# Patient Record
Sex: Male | Born: 1990 | Race: White | Hispanic: No | Marital: Single | Smoking: Current every day smoker
Health system: Southern US, Community
[De-identification: ages and names within clinical notes are randomized; demographics above are authoritative.]

## PROBLEM LIST (undated history)

## (undated) DIAGNOSIS — F32A Depression, unspecified: Secondary | ICD-10-CM

## (undated) DIAGNOSIS — F329 Major depressive disorder, single episode, unspecified: Secondary | ICD-10-CM

## (undated) DIAGNOSIS — F319 Bipolar disorder, unspecified: Secondary | ICD-10-CM

## (undated) DIAGNOSIS — S3992XA Unspecified injury of lower back, initial encounter: Secondary | ICD-10-CM

## (undated) HISTORY — PX: OTHER SURGICAL HISTORY: SHX169

## (undated) HISTORY — PX: APPENDECTOMY: SHX54

---

## 2011-06-02 NOTE — BH Assessment (Signed)
Assessment Note   Nathan Poole is an 21 y.o. male. Pt presented to the [facility], escorted by the police officer that saw him sitting in a parking lot cutting himself with a piece of broken glass. He has a hx of self harm behaviors with multiple lacerations to his left forearm. He stated that he feels suicidal and states that he wants to kill himself, by cutting so badly that he will bleed to death. He stated a plan to continue cutting when he leaves the hospital ED. He recently broke up with his girlfriend and feels that it spurred this recent episode. He does not have weapons available. He has a previous dx of Depressive Disorder, NOS, with a previous hospitalization December 2012. His current medications are Depakote 250 mg TID,Welbutrin 100 mg BID, Lithonate 150 mg TID, Desyrel 50 mg HS, Abilify 10 mg QD. Pt's Hx of violence is unknown with no signs of hallucinations, delusions, obsessions. His SA hx includes beer, cannabis and Xanax. Pt currently lives with his mother and that he describes as having "difficult problems", he is unemployed and denies any legal hx. Pt stated that he has discontinued his provider services with [facility], because he did not feel it was helping him. He confirms that depression is on his mother's side of and refuses to contract for safety.  Pt states that he currently sees Dr. Blondell Reveal for medication management. Pt is accepted to [facility] by Dr. Allena Katz to Dr. Dan Humphreys, Rm 502-1.      Axis I: Depressive Disorder NOS Axis II: Deferred Axis III: No past medical history on file. Asthma, Seizures, Small Bowel Obstruction Axis IV: housing problems and other psychosocial or environmental problems Axis V: 21-30 behavior considerably influenced by delusions or hallucinations OR serious impairment in judgment, communication OR inability to function in almost all areas      Past Medical History: No past medical history on file.  No past surgical  history on file.  Family History: No family history on file.  Social History:  does not have a smoking history on file. He does not have any smokeless tobacco history on file. His alcohol and drug histories not on file.  Additional Social History:    Allergies: Allergies not on file  Home Medications:  No current facility-administered medications on file as of .   No current outpatient prescriptions on file as of .    OB/GYN Status:  No LMP for male patient.  General Assessment Data Location of Assessment: [facility] Assessment Services Living Arrangements: Family members Can pt return to current living arrangement?: Yes Admission Status: Voluntary Is patient capable of signing voluntary admission?: Yes Transfer from: Acute Hospital Referral Source: Self/Family/Friend  Education Status Is patient currently in school?: No  Risk to self Suicidal Ideation: Yes-Currently Present Suicidal Intent: Yes-Currently Present Is patient at risk for suicide?: Yes Suicidal Plan?: Yes-Currently Present Specify Current Suicidal Plan: Wants to cut self so badly the he bleeds to death Access to Means: Yes Specify Access to Suicidal Means: pt cut self with glass What has been your use of drugs/alcohol within the last 12 months?: beer weekly, cannabis monthly, xanax occasionally Previous Attempts/Gestures: Yes How many times?: 0  (number of attempts unknown) Triggers for Past Attempts: Family contact Intentional Self Injurious Behavior: Cutting Comment - Self Injurious Behavior: cutting Family Suicide History: Unknown Recent stressful life event(s): Conflict (Comment) (with his mother) Persecutory voices/beliefs?: No Depression: Yes Depression Symptoms: Feeling worthless/self pity Substance abuse history and/or treatment  for substance abuse?: Yes Suicide prevention information given to non-admitted patients: Not applicable  Risk to Others Homicidal Ideation: No Thoughts of Harm to Others:  No Current Homicidal Intent: No Current Homicidal Plan: No Access to Homicidal Means: No History of harm to others?: No Assessment of Violence: None Noted Does patient have access to weapons?: No Criminal Charges Pending?: No Does patient have a court date: No  Psychosis Hallucinations: None noted Delusions: None noted  Mental Status Report Appear/Hygiene: Other (Comment) (unknown) Eye Contact: Good Motor Activity: Freedom of movement Speech: Logical/coherent Level of Consciousness: Alert Mood: Depressed;Sad;Helpless Affect: Appropriate to circumstance Anxiety Level: Minimal Thought Processes: Coherent;Relevant Judgement: Impaired Orientation: Person;Place;Time;Situation Obsessive Compulsive Thoughts/Behaviors: None  Cognitive Functioning Concentration: Normal Memory: Recent Intact;Remote Intact IQ: Average Insight: Poor Impulse Control: Poor Appetite: Fair Sleep: No Change Total Hours of Sleep: 8  Vegetative Symptoms: None  Prior Inpatient Therapy Prior Inpatient Therapy: Yes Prior Therapy Dates: 0 (unknown) Reason for Treatment: self harm behaviors  Prior Outpatient Therapy Prior Outpatient Therapy: Yes Reason for Treatment: self harm behaviors                     Additional Information 1:1 In Past 12 Months?: No CIRT Risk: No Elopement Risk: No Does patient have medical clearance?: No     Disposition: pt accepted to [facility] by Dr. Allena Katz to Dr. Dan Humphreys, Rm 502-1.   Disposition Disposition of Patient: Inpatient treatment program Type of inpatient treatment program: Adult  On Site Evaluation by:   Reviewed with Physician:     Manual Meier 06/02/2011 4:39 PM

## 2011-06-03 ENCOUNTER — Encounter (HOSPITAL_COMMUNITY): Payer: Self-pay | Admitting: *Deleted

## 2011-06-03 ENCOUNTER — Inpatient Hospital Stay (HOSPITAL_COMMUNITY)
Admission: AD | Admit: 2011-06-03 | Discharge: 2011-06-08 | DRG: 885 | Disposition: A | Discharge status: Home / Self Care | Payer: 59 | Attending: Psychiatry | Admitting: Psychiatry

## 2011-06-03 DIAGNOSIS — Z885 Allergy status to narcotic agent status: Secondary | ICD-10-CM

## 2011-06-03 DIAGNOSIS — Z880 Allergy status to penicillin: Secondary | ICD-10-CM

## 2011-06-03 DIAGNOSIS — F332 Major depressive disorder, recurrent severe without psychotic features: Principal | ICD-10-CM

## 2011-06-03 DIAGNOSIS — F339 Major depressive disorder, recurrent, unspecified: Secondary | ICD-10-CM

## 2011-06-03 DIAGNOSIS — F329 Major depressive disorder, single episode, unspecified: Secondary | ICD-10-CM

## 2011-06-03 DIAGNOSIS — F3289 Other specified depressive episodes: Secondary | ICD-10-CM

## 2011-06-03 DIAGNOSIS — J45909 Unspecified asthma, uncomplicated: Secondary | ICD-10-CM

## 2011-06-03 DIAGNOSIS — R45851 Suicidal ideations: Secondary | ICD-10-CM

## 2011-06-03 DIAGNOSIS — Z79899 Other long term (current) drug therapy: Secondary | ICD-10-CM

## 2011-06-03 LAB — CBC
HCT: 39 % (ref 39.0–52.0)
MCHC: 34.9 g/dL (ref 30.0–36.0)
MCV: 85.7 fL (ref 78.0–100.0)
RDW: 13 % (ref 11.5–15.5)

## 2011-06-03 LAB — COMPREHENSIVE METABOLIC PANEL
Albumin: 4 g/dL (ref 3.5–5.2)
BUN: 11 mg/dL (ref 6–23)
Calcium: 9.8 mg/dL (ref 8.4–10.5)
Creatinine, Ser: 0.89 mg/dL (ref 0.50–1.35)
Total Protein: 7.8 g/dL (ref 6.0–8.3)

## 2011-06-03 MED ORDER — ALBUTEROL SULFATE HFA 108 (90 BASE) MCG/ACT IN AERS
2.0000 | INHALATION_SPRAY | Freq: Two times a day (BID) | RESPIRATORY_TRACT | Status: DC | PRN
Start: 2011-06-03 — End: 2011-06-08

## 2011-06-03 MED ORDER — MAGNESIUM HYDROXIDE 400 MG/5ML PO SUSP: 30.0000 mL | Freq: Every day | ORAL | Status: DC | PRN

## 2011-06-03 MED ORDER — ARIPIPRAZOLE 5 MG PO TABS: 5.0000 mg | ORAL_TABLET | Freq: Every day | ORAL | Status: DC

## 2011-06-03 MED ORDER — ALUM & MAG HYDROXIDE-SIMETH 200-200-20 MG/5ML PO SUSP: 30.0000 mL | ORAL | Status: DC | PRN

## 2011-06-03 MED ORDER — RISPERIDONE 0.5 MG PO TABS
0.5000 mg | ORAL_TABLET | Freq: Every day | ORAL | Status: DC
  Administered 2011-06-03: 0.5 mg via ORAL
  Filled 2011-06-03 (×2): qty 1

## 2011-06-03 MED ORDER — TRAZODONE HCL 100 MG PO TABS
200.0000 mg | ORAL_TABLET | Freq: Every day | ORAL | Status: DC
  Administered 2011-06-03 – 2011-06-07 (×5): 200 mg via ORAL
  Filled 2011-06-03 (×6): qty 2
  Filled 2011-06-03: qty 6

## 2011-06-03 MED ORDER — NICOTINE POLACRILEX 2 MG MT GUM
2.0000 mg | CHEWING_GUM | OROMUCOSAL | Status: DC | PRN
  Administered 2011-06-04 – 2011-06-08 (×14): 2 mg via ORAL
  Filled 2011-06-03: qty 2

## 2011-06-03 MED ORDER — ACETAMINOPHEN 325 MG PO TABS
650.0000 mg | ORAL_TABLET | Freq: Four times a day (QID) | ORAL | Status: DC | PRN
  Administered 2011-06-05: 650 mg via ORAL

## 2011-06-03 NOTE — Progress Notes (Signed)
Patient ID: Nathan Poole, male   DOB: 01-20-1991, 21 y.o.   MRN: [id number]  Patient was pleasant and cooperative during the assessment. STated that he cuts when he gets angry. Stated that after an argument with his mother, he began cutting his wrist. Pt has superficial cuts to both wrists. "I don't like when my mom yells at me because it makes me have flashbacks".  Stated that even though he's invol he's glad that he's at [facility]. Support and encouragement was offered.

## 2011-06-03 NOTE — Progress Notes (Addendum)
Patient ID: Nathan Poole, male   DOB: 1990-09-06, 21 y.o.   MRN: [id number] Pt is involuntary. Pt states he is here due to a suicide attempt where he attempted to slit his wrist. Pt stated he did this because he was upset with a lot of people. Pt states he is not depressed just very angry. Pt would like help with anger management while he is here. Pt denies SI/HI. Pt is pleasant and cooperative. Pt has cut marks to both forearms. Pt has a hx of cutting especially when he is mad. Pt states that he has a substance abuse problem and use maurijuana twice a week.
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
## 2011-06-03 NOTE — Tx Team (Signed)
Initial Interdisciplinary Treatment Plan  PATIENT STRENGTHS: (choose at least two) Ability for insight Active sense of humor Communication skills  PATIENT STRESSORS: Marital or family conflict Substance abuse   PROBLEM LIST: Problem List/Patient Goals Date to be addressed Date deferred Reason deferred Estimated date of resolution  "Anger management" 06/03/11     "Substance Abuse"  06/03/11                                                 DISCHARGE CRITERIA:  Ability to meet basic life and health needs Improved stabilization in mood, thinking, and/or behavior Reduction of life-threatening or endangering symptoms to within safe limits  PRELIMINARY DISCHARGE PLAN: Attend aftercare/continuing care group Return to previous living arrangement  PATIENT/FAMIILY INVOLVEMENT: This treatment plan has been presented to and reviewed with the patient, Nathan Poole, and/or family member.  The patient and family have been given the opportunity to ask questions and make suggestions.  Nathan Poole 06/03/2011, 4:24 PM

## 2011-06-03 NOTE — H&P (Signed)
Medical/psychiatric screening examination/treatment/procedure(s) were performed by non-physician practitioner and as supervising physician I was immediately available for consultation/collaboration.   I have seen and examined this patient and agree with this evaluation.  
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
## 2011-06-04 LAB — TSH: TSH: 0.583 u[IU]/mL (ref 0.350–4.500)

## 2011-06-04 LAB — VITAMIN D 25 HYDROXY (VIT D DEFICIENCY, FRACTURES): Vit D, 25-Hydroxy: 23 ng/mL — ABNORMAL LOW (ref 30–89)

## 2011-06-04 MED ORDER — RISPERIDONE 0.5 MG PO TABS
0.5000 mg | ORAL_TABLET | Freq: Two times a day (BID) | ORAL | Status: DC
  Administered 2011-06-04 – 2011-06-08 (×9): 0.5 mg via ORAL
  Filled 2011-06-04: qty 6
  Filled 2011-06-04: qty 1
  Filled 2011-06-04: qty 6
  Filled 2011-06-04 (×9): qty 1

## 2011-06-04 NOTE — Progress Notes (Signed)
Pt rates depression at a 6 and hopelessness at a 3. Pt attends groups and interacts well with peers and staff. Pt was offered support and encouragement. Pt would like to go to the state hospital.Pt denies SI/HI. Pt is receptive to treatment and safety is maintained on unit.

## 2011-06-04 NOTE — Progress Notes (Signed)
Pt has been observed interacting well on the unit. Pt attended karaoke this evening. Pt  reports no thoughts of SI/HI at this time. Pt reports no concerns he wishes to address with this writer at this time. Continued support and availability as needed has been extended to this pt. Pt safety remains with q64min checks.

## 2011-06-04 NOTE — Tx Team (Signed)
Interdisciplinary Treatment Plan Update (Adult)  Date:  06/04/2011  Time Reviewed:  10:26 AM   Progress in Treatment: Attending groups: Yes Participating in groups:  Yes Taking medication as prescribed: Yes Tolerating medication:  Yes Family/Significant othe contact made:  Counselor assessing for appropriate contact Patient understands diagnosis:  Yes Discussing patient identified problems/goals with staff:  Yes Medical problems stabilized or resolved:  Yes Denies suicidal/homicidal ideation: Yes Issues/concerns per patient self-inventory:  None identified Other: N/A  New problem(s) identified: None Identified  Reason for Continuation of Hospitalization: Anxiety Depression Medication stabilization Suicidal ideation  Interventions implemented related to continuation of hospitalization: mood stabilization, medication monitoring and adjustment, group therapy and psycho education, safety checks q 15 mins  Additional comments: N/A  Estimated length of stay: 3-5 days  Discharge Plan: SW will assess for appropriate referrals.    New goal(s): N/A  Review of initial/current patient goals per problem list:    1.  Goal(s): Reduce depressive symptoms  Met:  No  Target date: by discharge  As evidenced by: Reducing depression from a 10 to a 3 as reported by pt.   2.  Goal (s): Eliminate SI  Met:  No  Target date: by discharge  As evidenced by: pt reporting no SI.    3.  Goal(s): Address substance abuse  Met:  No  Target date: by discharge  As evidenced by: Pt reports marijuana use and wants to address this in group therapy   Attendees: Patient:  Nathan Poole 06/04/2011 11:31 AM   Family:     Physician:  Orson Aloe, MD  06/04/2011  10:26 AM   Nursing:   Quintella Reichert, RN 06/04/2011 11:31 AM   Case Manager:  Reyes Ivan, LCSWA 06/04/2011  10:26 AM   Counselor:  06/04/2011  10:26 AM   Other:  Juline Patch, LCSW 06/04/2011  10:26 AM   Other:  Wilmon Arms, SW intern 06/04/2011  10:26 AM   Other:  Serena Colonel, NP 06/04/2011 11:32 AM   Other:      Scribe for Treatment Team:   Carmina Miller, 06/04/2011 , 10:26 AM
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
## 2011-06-05 MED ORDER — GABAPENTIN 300 MG PO CAPS
300.0000 mg | ORAL_CAPSULE | Freq: Three times a day (TID) | ORAL | Status: DC
  Administered 2011-06-05 – 2011-06-08 (×9): 300 mg via ORAL
  Filled 2011-06-05 (×2): qty 1
  Filled 2011-06-05: qty 6
  Filled 2011-06-05: qty 1
  Filled 2011-06-05: qty 6
  Filled 2011-06-05 (×2): qty 1
  Filled 2011-06-05: qty 6
  Filled 2011-06-05 (×6): qty 1

## 2011-06-05 MED ORDER — CITALOPRAM HYDROBROMIDE 10 MG PO TABS
10.0000 mg | ORAL_TABLET | Freq: Every day | ORAL | Status: DC
  Administered 2011-06-05 – 2011-06-08 (×4): 10 mg via ORAL
  Filled 2011-06-05 (×2): qty 1
  Filled 2011-06-05: qty 14
  Filled 2011-06-05 (×3): qty 1

## 2011-06-05 MED ORDER — CLONIDINE HCL 0.1 MG PO TABS
0.1000 mg | ORAL_TABLET | Freq: Every evening | ORAL | Status: DC | PRN
  Administered 2011-06-05 – 2011-06-07 (×5): 0.1 mg via ORAL
  Filled 2011-06-05 (×9): qty 1

## 2011-06-05 MED ORDER — CALCIUM CARBONATE-VITAMIN D 500-200 MG-UNIT PO TABS
1.0000 | ORAL_TABLET | Freq: Two times a day (BID) | ORAL | Status: DC
  Administered 2011-06-06 – 2011-06-08 (×5): 1 via ORAL
  Filled 2011-06-05 (×8): qty 1

## 2011-06-05 MED ORDER — CLONIDINE HCL ER 0.1 MG PO TB12
0.1000 mg | ORAL_TABLET | Freq: Every day | ORAL | Status: DC
  Administered 2011-06-05 – 2011-06-07 (×3): 0.1 mg via ORAL
  Filled 2011-06-05 (×4): qty 1
  Filled 2011-06-05: qty 2

## 2011-06-05 MED ORDER — VITAMIN D (ERGOCALCIFEROL) 1.25 MG (50000 UNIT) PO CAPS
50000.0000 [IU] | ORAL_CAPSULE | ORAL | Status: DC
  Administered 2011-06-06: 50000 [IU] via ORAL
  Filled 2011-06-05: qty 1

## 2011-06-05 MED ORDER — CITALOPRAM HYDROBROMIDE 10 MG/5ML PO SOLN: 10.0000 mg | Freq: Every day | ORAL | Status: DC

## 2011-06-05 NOTE — Progress Notes (Signed)
Patient ID: Nathan Poole, male   DOB: 02-09-1991, 21 y.o.   MRN: [id number] Pt seen in his room individually and in treatment team.  He was sleeping face down and aroused after knocking some paper loudly near him and speaking his name loudly.  He stirred only minimally initially.  He answered questions slowly, but then got to a conversational cadence as the interview in his room progressed.  He stated that his anger gets the most intense during the middle of the day.  He noted some benefit from the Risperdal at [facility] the night before.  He agreed to take Risperdal during the day.  He expressed a desire to go back to [facility] where he had been for a long time.  He stated that he felt comfortable there and wished to return there.  In treatment team he reiterated the same notion of desiring to return to [facility]. He described his mood as pretty good and his anxiety pretty good first thing in the morning, but that during the early afternoon is when his moods get more harsh.  I ordered the Risperdal for 1200 and 2000.  He was agreeable to that plan.

## 2011-06-05 NOTE — Progress Notes (Signed)
Recreation Therapy Notes  06/05/2011         Time: 1415      Group Topic/Focus: The focus of the group is on enhancing the patients' ability to cope with stressors by understanding what coping is, why it is important, the negative effects of stress and developing healthier coping skills. Patients asked to complete a fifteen minute plan, outlining three triggers, three supports, and fifteen coping activities.  Participation Level: Active  Participation Quality: Attentive  Affect: Blunted  Cognitive: Oriented   Additional Comments: Patient continues to blame his mother for everything, didn't respond to challenging by RT.  Ginny Loomer 06/05/2011 4:05 PM

## 2011-06-05 NOTE — Progress Notes (Signed)
Pt denies SI/HI/AVH. Pt grandiose. Pt states that he bought his friend an official Ravens' football from 1972 and if the Ravens win the ball will be worth 3.1 million dollars. Pt cooperative. Continue Q15 min checks.

## 2011-06-05 NOTE — Progress Notes (Signed)
[facility] Group Notes:  (Counselor/Nursing/MHT/Case Management/Adjunct)  06/05/2011 12:55 PM  Type of Therapy:  Group Therapy  Participation Level:  None  Participation Quality:  Appropriate and Attentive  Affect:  Appropriate  Cognitive:  Alert and Appropriate  Insight:  None  Engagement in Group:  None  Engagement in Therapy:  None  Modes of Intervention:  Education and Exploration  Summary of Progress/Problems: Patient did not verbally participate in group discussion.   Wilmon Arms 06/05/2011, 12:55 PM  Cosigned by: Angus Palms, LCSW

## 2011-06-05 NOTE — Progress Notes (Addendum)
[facility] MD Progress Note  06/05/2011 10:13 PM  ADL's:  Intact  Sleep: Poor, will add Clonidine and Kapvay for this  Appetite:  Good  Suicidal Ideation:  Denies adamantly any suicidal thoughts. Homicidal Ideation:  Denies adamantly any homicidal thoughts.  Mental Status Examination/Evaluation: Objective:  Appearance: Casual  Eye Contact::  Good  Speech:  Clear and Coherent  Volume:  Normal  Mood:  Anxious and Depressed  Affect:  Congruent  Thought Process:  Coherent  Orientation:  Full  Thought Content:  WDL  Suicidal Thoughts:  No  Homicidal Thoughts:  No  Memory:  Immediate;   Good  Judgement:  Fair  Insight:  Fair  Psychomotor Activity:  Normal  Concentration:  Fair  Recall:  Good  Akathisia:  No  AIMS (if indicated):     Assets:  Communication Skills Desire for Improvement Leisure Time  Sleep:  Number of Hours: 5.75    Vital Signs:Blood pressure 133/71, pulse 80, temperature 96.7 F (35.9 C), temperature source Oral, resp. rate 16, height 6\' 6"  (1.981 m), weight 99.791 kg (220 lb). Current Medications: Current Facility-Administered Medications  Medication Dose Route Frequency Provider Last Rate Last Dose  . acetaminophen (TYLENOL) tablet 650 mg  650 mg Oral Q6H PRN Orson Aloe, MD   650 mg at 06/05/11 1256  . albuterol (PROVENTIL HFA;VENTOLIN HFA) 108 (90 BASE) MCG/ACT inhaler 2 puff  2 puff Inhalation BID PRN Orson Aloe, MD      . alum & mag hydroxide-simeth (MAALOX/MYLANTA) 200-200-20 MG/5ML suspension 30 mL  30 mL Oral Q4H PRN Orson Aloe, MD      . citalopram (CELEXA) tablet 10 mg  10 mg Oral Daily Orson Aloe, MD   10 mg at 06/05/11 1834  . cloNIDine (CATAPRES) tablet 0.1 mg  0.1 mg Oral QHS,MR X 1 Orson Aloe, MD   0.1 mg at 06/05/11 2203  . cloNIDine HCl (KAPVAY) ER tablet 0.1 mg  0.1 mg Oral QHS Orson Aloe, MD      . gabapentin (NEURONTIN) capsule 300 mg  300 mg Oral TID Orson Aloe, MD   300 mg at 06/05/11 1834  . magnesium hydroxide (MILK OF  MAGNESIA) suspension 30 mL  30 mL Oral Daily PRN Orson Aloe, MD      . nicotine polacrilex (NICORETTE) gum 2 mg  2 mg Oral Q2H PRN Orson Aloe, MD   2 mg at 06/04/11 2155  . risperiDONE (RISPERDAL) tablet 0.5 mg  0.5 mg Oral BID AC Orson Aloe, MD   0.5 mg at 06/05/11 2203  . traZODone (DESYREL) tablet 200 mg  200 mg Oral QHS Orson Aloe, MD   200 mg at 06/05/11 2203  . DISCONTD: citalopram (CELEXA) 10 MG/5ML suspension 10 mg  10 mg Oral Daily Orson Aloe, MD        Lab Results: No results found for this or any previous visit (from the past 48 hour(s)).  Physical Findings: AIMS:  , ,  ,  ,    CIWA:    COWS:     Treatment Plan Summary: Daily contact with patient to assess and evaluate symptoms and progress in treatment Medication management  Plan: Add Clonidine and Kapvay for difficulty sleeping and Neurontin for anxiety.  Plan for discharge for Tue of next week.  Pt agreeable with all these proposals. Vit D is low at 23 will order replacement Vit DDan Humphreys, Latanga Nedrow 06/05/2011, 10:13 PM

## 2011-06-05 NOTE — Progress Notes (Addendum)
Jaevin is consistently refusing to cooperate with counselors to complete his psychosocial assessment. He will not answer assessment questions for either the counseling intern or the counselor. In addition, he will not come to aftercare planning group after being encouraged and specifically invited by counseling intern and case manager. Counselor spoke with Avien and explained that both assessment and groups are a part of treatment here. Elick was argumentative, but agreed to come to group, though he remained irritable. Carly's statments during the assessment were sometimes contradictory (states he worked at [employer] for 4 years but that he was in [place] for two of those years, and that he was in college at [employer] for 3 of them. ) and increasingly histrionic in nature. Counselor suspects he may not be a reliable reporter.  Billie Lade 06/05/2011  8:37 AM

## 2011-06-05 NOTE — Progress Notes (Signed)
Pt attended discharge planning group and minimally participated.  Pt reports not sleeping well last night.  Pt ranks depression at a 6 and anxiety at a 5 today.  Pt denies SI at this time.  Pt was asked further questions by the counselor.  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  Pt also states that he went to [employer] in [place] and transferred to [employer] for a double major and has 11 months left to graduate.  Pt states he moved here 3 months ago.  Pt states he worked at [employer] for 4 years and currently works as a Clinical cytogeneticist.  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  SW notes the timeline for all of this does not match up and appears to be grandiose.  [facility] and is on the waiting list.  No further needs at this time.    Reyes Ivan, LCSWA 06/05/2011  10:56 AM

## 2011-06-05 NOTE — Progress Notes (Signed)
Pt does not a good grasp on reality. Pt is a bad historian and all of his stories does not make sense. Pt has stated that he would like to go to the state hospital. Pt stated that he can not go back home to his mother because she upsets him which causes him to cut. Pt attends groups but lacks insight. Pt was offered support and encouragement. Pt safety maintained on unit.

## 2011-06-06 DIAGNOSIS — F339 Major depressive disorder, recurrent, unspecified: Secondary | ICD-10-CM | POA: Diagnosis present

## 2011-06-06 DIAGNOSIS — F332 Major depressive disorder, recurrent severe without psychotic features: Principal | ICD-10-CM

## 2011-06-06 NOTE — Progress Notes (Signed)
Pt is asleep at this time. Pt appears to be in no sings of distress. Writer will continue to monitor pt. Pt safety remains with q69min checks.

## 2011-06-06 NOTE — Progress Notes (Signed)
[facility] MD Progress Note  06/06/2011 4:17 PM  "I have to go back to work on Tuesday 06/09/11. 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 I can't lose this job and I don't want to be fired either. I must be discharged on Monday. I am feeling well. I am ready to go home. I don't feel suicidal no more"  Diagnosis:   Axis I: Major depressive disorder, recuurent Axis II: Borderline behavior Axis III:  Past Medical History  Diagnosis Date  . Asthma    Axis IV: No changes Axis V: 51-60 moderate symptoms  ADL's:  Intact  Sleep: Good  Appetite:  Good  Suicidal Ideation: Plan:  None Intent:  None Means:  None Homicidal Ideation:  Plan:  None Intent:  None Means:  None  AEB (as evidenced by): Per patient's report Mental Status Examination/Evaluation: Objective:  Appearance: Casual  Eye Contact::  Good  Speech:  Clear and Coherent  Volume:  Normal  Mood:  Euthymic  Affect:  Appropriate  Thought Process:  Intact  Orientation:  Full  Thought Content:  Clear  Suicidal Thoughts:  No  Homicidal Thoughts:  No  Memory:  Immediate;   Good  Judgement:  Fair  Insight:  Lacking  Psychomotor Activity:  Normal  Concentration:  Good  Recall:  Good  Akathisia:  No  Handed:  Right  AIMS (if indicated):     Assets:  Others:  eager to continue employment  Sleep:  Number of Hours: 6.75    Vital Signs:Blood pressure 78/48, pulse 86, temperature 97.7 F (36.5 C), temperature source Oral, resp. rate 20, height 6\' 6"  (1.981 m), weight 99.791 kg (220 lb). Current Medications: Current Facility-Administered Medications  Medication Dose Route Frequency Provider Last Rate Last Dose  . acetaminophen (TYLENOL) tablet 650 mg  650 mg Oral Q6H PRN Orson Aloe, MD   650 mg at 06/05/11 1256  . albuterol (PROVENTIL HFA;VENTOLIN HFA) 108 (90 BASE) MCG/ACT inhaler 2 puff  2 puff Inhalation BID PRN Orson Aloe, MD      . alum & mag hydroxide-simeth (MAALOX/MYLANTA) 200-200-20 MG/5ML suspension 30 mL  30 mL Oral  Q4H PRN Orson Aloe, MD      . calcium-vitamin D (OSCAL WITH D) 500-200 MG-UNIT per tablet 1 tablet  1 tablet Oral BID Orson Aloe, MD   1 tablet at 06/06/11 0800  . citalopram (CELEXA) tablet 10 mg  10 mg Oral Daily Orson Aloe, MD   10 mg at 06/06/11 0946  . cloNIDine (CATAPRES) tablet 0.1 mg  0.1 mg Oral QHS,MR X 1 Orson Aloe, MD   0.1 mg at 06/05/11 2203  . cloNIDine HCl (KAPVAY) ER tablet 0.1 mg  0.1 mg Oral QHS Orson Aloe, MD   0.1 mg at 06/05/11 2219  . gabapentin (NEURONTIN) capsule 300 mg  300 mg Oral TID Orson Aloe, MD   300 mg at 06/06/11 1438  . magnesium hydroxide (MILK OF MAGNESIA) suspension 30 mL  30 mL Oral Daily PRN Orson Aloe, MD      . nicotine polacrilex (NICORETTE) gum 2 mg  2 mg Oral Q2H PRN Orson Aloe, MD   2 mg at 06/06/11 1437  . risperiDONE (RISPERDAL) tablet 0.5 mg  0.5 mg Oral BID AC Orson Aloe, MD   0.5 mg at 06/06/11 1244  . traZODone (DESYREL) tablet 200 mg  200 mg Oral QHS Orson Aloe, MD   200 mg at 06/05/11 2203  . Vitamin D (Ergocalciferol) (DRISDOL) capsule 50,000 Units  50,000 Units  Oral Q Julienne Kass, MD   50,000 Units at 06/06/11 [phone]  . DISCONTD: citalopram (CELEXA) 10 MG/5ML suspension 10 mg  10 mg Oral Daily Orson Aloe, MD        Lab Results: No results found for this or any previous visit (from the past 48 hour(s)).  Physical Findings: AIMS:  , ,  ,  ,    CIWA:    COWS:     Treatment Plan Summary: Daily contact with patient to assess and evaluate symptoms and progress in treatment Medication management  Plan: Continue current treatment plan.  Armandina Stammer I 06/06/2011, 4:17 PM

## 2011-06-06 NOTE — Progress Notes (Signed)
Patient ID: Nathan Poole, male   DOB: Nov 11, 1990, 21 y.o.   MRN: [id number] 06/06/2011  Nursing 1800   [name] is seen OOB UAL on the 500 hall today...tolerated wel. HE makes good eye contact. He is pleasant , takes his meds as ordered, and attends his groups as ordered.   A He completed his self invneotry and on it he wrote that he deneid SI, that his feelings of depression and hoplessness are " 6 / 2 " and that his DC plans include to " take my coping skills with me". R Safety is maintaiend and POC incldues fosteruing therapueitc relationship PD RN [facility]

## 2011-06-06 NOTE — Progress Notes (Signed)
Patient was seen by writer at the medication window to receive his scheduled 2000 medications. Patient reports having had a good day and is looking forward to attending AA meeting on 300 hall. Patient was informed of his 2200 and 2300 scheduled medications and is agreeable to taking them. Patient currently denies having pain, -si/hi/a/v hall. Safety  maintained on unit will continue to monitor.

## 2011-06-06 NOTE — Progress Notes (Signed)
[facility] Group Notes:  (Counselor/Nursing/MHT/Case Management/Adjunct)  06/06/2011 1315  Type of Therapy:  Group Therapy  Participation Level:  Active  Participation Quality:  Appropriate and Attentive  Affect:  Appropriate  Cognitive:  Appropriate  Insight:  Good  Engagement in Group:  Good  Engagement in Therapy:  Good  Modes of Intervention:  Activity, Problem-solving and Socialization  Summary of Progress/Problems:  Pt. attended and participated in group session on self-sabotage. A Dialectical Behavior Therapy   Exercise was presented to the pt. and pt. was asked to identify how their emotional and/or rational behaviors affect how they sabotage themselves.   Pt shared that he is still grieving over the loss of his brother who was in the [employer]	[place]. Pt states that the loss of his brother has been difficult because he was the person that helps him with his problems. Pt states that he served 2 years in [place] in the [employer] and had to D/C from the [employer] due to his brothers death. Pt did not participate until the end of group, however he was attentive and interactive with other group members.   Joretta Eads 06/06/2011; 3:26PM

## 2011-06-07 NOTE — Progress Notes (Signed)
Patient has been in the dayroom watching tv and interacting appropriately with peers. Patient has voiced no complaints, took his hs medications and currently denies having pain, -si/hi/a/v hall. Safety maintained on unit, will continue to monitor.

## 2011-06-07 NOTE — Progress Notes (Signed)
Spoke to pt and he stated that he would like to go home instead of going to [facility] due to needing to be at work Tues and wanting to be home for his daughters birthdays. He spoke about a plan to take the bus to [facility] where his mother can pick him up. He stated he no longer wants his mother to be contacted for the number he gave is his cell and he does not want his mother called about his D/C. Pt does have bed avaiable for him at [facility] Monday.

## 2011-06-07 NOTE — Progress Notes (Signed)
[facility] MD Progress Note  06/07/2011 6:45 PM  "I am doing fine. I just want to know that I will be going home in the morning. I have to be at work on Tuesday. I can't loose a job that I make $4,550 a week. I am not suicidal or homicidal. I am just fine. Ready to go" Social worker reports today that a bed is available for patient at the [facility] in [place]. She indicated that patient will be transferring to [facility] sometime tomorrow 06/08/11  Diagnosis:   Axis I: Major Depression, Recurrent severe Axis II: Cluster B Traits Axis III:  Past Medical History  Diagnosis Date  . Asthma    Axis IV: other psychosocial or environmental problems Axis V: 41-50 serious symptoms  ADL's:  Intact  Sleep: Good  Appetite:  Good  Suicidal Ideation:  Plan:  None Intent:  None Means:  None Homicidal Ideation:  Plan:  None Intent:  None Means:  None  AEB (as evidenced by): Per patient's report  Mental Status Examination/Evaluation: Objective:  Appearance: Casual  Eye Contact::  Good  Speech:  Clear and Coherent  Volume:  Normal  Mood:  Euphoric  Affect:  Congruent  Thought Process:  Irrational  Orientation:  Full  Thought Content:  Delusions  Suicidal Thoughts:  No  Homicidal Thoughts:  No  Memory:  Immediate;   Good  Judgement:  Impaired  Insight:  Lacking  Psychomotor Activity:  Normal  Concentration:  Good  Recall:  Good  Akathisia:  No  Handed:  Right  AIMS (if indicated):     Assets:  Social Support  Sleep:  Number of Hours: 6.75    Vital Signs:Blood pressure 104/57, pulse 91, temperature 97.6 F (36.4 C), temperature source Oral, resp. rate 16, height 6\' 6"  (1.981 m), weight 99.791 kg (220 lb). Current Medications: Current Facility-Administered Medications  Medication Dose Route Frequency Provider Last Rate Last Dose  . acetaminophen (TYLENOL) tablet 650 mg  650 mg Oral Q6H PRN Orson Aloe, MD   650 mg at 06/05/11 1256  . albuterol (PROVENTIL HFA;VENTOLIN HFA)  108 (90 BASE) MCG/ACT inhaler 2 puff  2 puff Inhalation BID PRN Orson Aloe, MD      . alum & mag hydroxide-simeth (MAALOX/MYLANTA) 200-200-20 MG/5ML suspension 30 mL  30 mL Oral Q4H PRN Orson Aloe, MD      . calcium-vitamin D (OSCAL WITH D) 500-200 MG-UNIT per tablet 1 tablet  1 tablet Oral BID Orson Aloe, MD   1 tablet at 06/07/11 1706  . citalopram (CELEXA) tablet 10 mg  10 mg Oral Daily Orson Aloe, MD   10 mg at 06/07/11 0813  . cloNIDine (CATAPRES) tablet 0.1 mg  0.1 mg Oral QHS,MR X 1 Orson Aloe, MD   0.1 mg at 06/06/11 2256  . cloNIDine HCl (KAPVAY) ER tablet 0.1 mg  0.1 mg Oral QHS Orson Aloe, MD   0.1 mg at 06/06/11 2207  . gabapentin (NEURONTIN) capsule 300 mg  300 mg Oral TID Orson Aloe, MD   300 mg at 06/07/11 1414  . magnesium hydroxide (MILK OF MAGNESIA) suspension 30 mL  30 mL Oral Daily PRN Orson Aloe, MD      . nicotine polacrilex (NICORETTE) gum 2 mg  2 mg Oral Q2H PRN Orson Aloe, MD   2 mg at 06/07/11 1707  . risperiDONE (RISPERDAL) tablet 0.5 mg  0.5 mg Oral BID AC Orson Aloe, MD   0.5 mg at 06/07/11 1247  . traZODone (DESYREL) tablet  200 mg  200 mg Oral QHS Orson Aloe, MD   200 mg at 06/06/11 2208  . Vitamin D (Ergocalciferol) (DRISDOL) capsule 50,000 Units  50,000 Units Oral Q Julienne Kass, MD   50,000 Units at 06/06/11 [phone]    Lab Results: No results found for this or any previous visit (from the past 48 hour(s)).  Physical Findings: AIMS:  , ,  ,  ,    CIWA:    COWS:     Treatment Plan Summary: Daily contact with patient to assess and evaluate symptoms and progress in treatment Medication management  Plan: Patient is likely going to [facility] 06/08/11 for further treatment.           He continue on current treatment plan.            Armandina Stammer I 06/07/2011, 6:45 PM

## 2011-06-07 NOTE — Progress Notes (Signed)
[facility] Group Notes:  (Counselor/Nursing/MHT/Case Management/Adjunct)  06/07/2011 1315  Type of Therapy:  Group Therapy  Participation Level:  Active  Participation Quality:  Appropriate, Attentive, Redirectable and Sharing  Affect:  Appropriate  Cognitive:  Appropriate  Insight:  Good  Engagement in Group:  Good  Engagement in Therapy:  Good  Modes of Intervention:  Clarification, Problem-solving, Socialization and Support  Summary of Progress/Problems: Pt attended and participated counseling group on supports. Pt was asked to define what support means to them, identify a support and explore what to do when support is not present. Pt states that he expects support from his family the most. Pt states that he also considers his friend a support to him because his friend enjoys sharing fun and interesting activities with him. Pt stated that his friend is his Sky Diving partner and that he understands him like no one else does.   Roddie Riegler 06/07/2011, 3:08 PM

## 2011-06-07 NOTE — Progress Notes (Signed)
Patient ID: Nathan Poole, male   DOB: [birth year], 21 y.o.   MRN: [id number] 06/07/2011  Nursing 1900 D Nathan Poole continues to make progress..he is compliant with his meds. He is attending his groups. He participates in the gorup discussion and is consciously trying to understand huis feelings and his related behaviors and to learn healthier ways of getting his needs met..ASSESSMENT: He completed his self inventory and on it he wrote that he denied SI, he rated his depression and hopelessness " 5 / 1 "  He stated his DC plans are to take his meds and play with his daughters. R Safety is amintaiend and POC includes fostering therapeutic relationship PD RN [facility]

## 2011-06-07 NOTE — Progress Notes (Signed)
06/07/2011 9:37 AM                           Case Manager Note: Pt attended after care planning group, pt denies SI/HI pt received suicide prevention informations and crisis numbers. Pt shared he believes he will discharge Monday and states he will follow-up with [facility] in [facility]. Cavan Bearden, LPCA

## 2011-06-08 MED ORDER — VITAMIN D (ERGOCALCIFEROL) 1.25 MG (50000 UNIT) PO CAPS: 50000.0000 [IU] | ORAL_CAPSULE | ORAL | Status: DC

## 2011-06-08 MED ORDER — CITALOPRAM HYDROBROMIDE 10 MG PO TABS: 10.0000 mg | ORAL_TABLET | Freq: Every day | ORAL | Status: DC

## 2011-06-08 MED ORDER — CLONIDINE HCL 0.1 MG PO TABS: 0.1000 mg | ORAL_TABLET | Freq: Every evening | ORAL | Status: DC | PRN

## 2011-06-08 MED ORDER — RISPERIDONE 0.5 MG PO TABS: 0.5000 mg | ORAL_TABLET | Freq: Two times a day (BID) | ORAL | Status: DC

## 2011-06-08 MED ORDER — CLONIDINE HCL ER 0.1 MG PO TB12: 0.1000 mg | ORAL_TABLET | Freq: Every day | ORAL | Status: DC

## 2011-06-08 MED ORDER — GABAPENTIN 300 MG PO CAPS: 300.0000 mg | ORAL_CAPSULE | Freq: Three times a day (TID) | ORAL | Status: DC

## 2011-06-08 MED ORDER — ALBUTEROL SULFATE HFA 108 (90 BASE) MCG/ACT IN AERS: 2.0000 | INHALATION_SPRAY | Freq: Two times a day (BID) | RESPIRATORY_TRACT | Status: DC | PRN

## 2011-06-08 MED ORDER — TRAZODONE HCL 100 MG PO TABS: 200.0000 mg | ORAL_TABLET | Freq: Every day | ORAL | Status: DC

## 2011-06-08 MED ORDER — CALCIUM CARBONATE-VITAMIN D 500-200 MG-UNIT PO TABS: 1.0000 | ORAL_TABLET | Freq: Two times a day (BID) | ORAL | Status: DC

## 2011-06-08 NOTE — Progress Notes (Signed)
[facility] Group Notes: (Counselor/Nursing/MHT/Case Management/Adjunct) 06/08/2011   @1 :15pm  Type of Therapy:  Group Therapy  Participation Level:  Active  Participation Quality:  Attentive, Appropriate  Affect:  Appropriate  Cognitive:  Appropriate  Insight:  None  Engagement in Group: Active  Engagement in Therapy:  Limited  Modes of Intervention:  Support and Exploration  Summary of Progress/Problems: Nathan Poole participated in color breathing technique and safe place guide imagery meditation. He did not share his experience of the exercises.   Billie Lade 06/08/2011 2:12 PM

## 2011-06-08 NOTE — Progress Notes (Signed)
[facility] Case Management Discharge Plan:  Will you be returning to the same living situation after discharge: Yes,  return home At discharge, do you have transportation home?:Yes,  provided bus pass home Do you have the ability to pay for your medications:Yes,  provided samples  Interagency Information:   Release of information consent forms completed and in the chart; pt's signature needed at discharge.   Release of information consent forms completed and in the chart;  Patient's signature needed at discharge.  Patient to Follow up at:  Follow-up Information    Follow up with [facility] in [place] on 06/10/2011. (Appointment scheduled at 8:00 am, Auth # [id number])    Contact information:   [address] [place], [place] [address] [phone]         Patient denies SI/HI:   Yes,  denies today    Safety Planning and Suicide Prevention discussed:  Yes,  discussed with pt  Barrier to discharge identified:No.  Summary and Recommendations: Pt attended discharge planning group and actively participated.  Pt presents with calm mood and affect.  Pt reports feeling much better and ready to d/c today.  Pt states that he has no depression or SI today.  Pt states that his anxiety is high, ranking it at a 10.  Pt states he is eager to d/c to work at [employer].  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  Pt also states that he bid and won $100,000 on the game.  Pt states that he will return home and needs a bus pass to get home.  No recommendations from SW.  No further needs voiced by pt.  Pt stable to discharge.      Carmina Miller 06/08/2011, 12:38 PM

## 2011-06-08 NOTE — [facility] Suicide Risk Assessment (Signed)
Suicide Risk Assessment  Discharge Assessment     Demographic factors:  Assessment Details Time of Assessment: Admission Information Obtained From: Family Current Mental Status:  Current Mental Status:  (Denies SI/HI) Risk Reduction Factors:  Risk Reduction Factors: Employed;Living with another person, especially a relative;Positive social support  CLINICAL FACTORS:   Severe Anxiety and/or Agitation Depression:   Anhedonia Hopelessness Personality Disorders:   Cluster B Previous Psychiatric Diagnoses and Treatments Medical Diagnoses and Treatments/Surgeries  COGNITIVE FEATURES THAT CONTRIBUTE TO RISK:  Thought constriction (tunnel vision)    SUICIDE RISK:   Minimal: No identifiable suicidal ideation.  Patients presenting with no risk factors but with morbid ruminations; may be classified as minimal risk based on the severity of the depressive symptoms  ADL's:  Intact  Sleep: Good  Appetite:  Good  Suicidal Ideation:  Denies adamantly any suicidal thoughts. Homicidal Ideation:  Denies adamantly any homicidal thoughts.  Mental Status Examination/Evaluation: Objective:  Appearance: Casual  Eye Contact::  Good  Speech:  Clear and Coherent  Volume:  Normal  Mood:  Euthymic  Affect:  Congruent  Thought Process:  Coherent  Orientation:  Full  Thought Content:  WDL  Suicidal Thoughts:  No  Homicidal Thoughts:  No  Memory:  Immediate;   Good  Judgement:  Good  Insight:  Good  Psychomotor Activity:  Normal  Concentration:  Good  Recall:  Good  Akathisia:  No  AIMS (if indicated):      Assets:  Communication Skills Desire for Improvement Housing  Sleep:  Number of Hours: 6.5     Vital Signs: Blood pressure 107/58, pulse 97, temperature 97.9 F (36.6 C), temperature source Oral, resp. rate 18, height 6\' 6"  (1.981 m), weight 99.791 kg (220 lb). Labs No results found for this or any previous visit (from the past 48 hour(s)).  What pt reports he has learned from his  stay is that he needs to do stuff with friends and he can write out his feelings and tear up the paper and then throw it away,   Risk of harm to self is elevated by his history of depression, but he now feels that he has to live for getting back with his friends and family.  Risk of harm to others is is minimal in that he has not been involved in fights or had any legal charges filed on him.  Plan: Current Medications:  . calcium-vitamin D  1 tablet Oral BID  . citalopram  10 mg Oral Daily  . cloNIDine  0.1 mg Oral QHS,MR X 1  . cloNIDine HCl  0.1 mg Oral QHS  . gabapentin  300 mg Oral TID  . risperiDONE  0.5 mg Oral BID AC  . traZODone  200 mg Oral QHS  . Vitamin D (Ergocalciferol)  50,000 Units Oral Q Juliann Mule, Omair Dettmer 06/08/2011, 12:28 PM

## 2011-06-08 NOTE — Tx Team (Signed)
Interdisciplinary Treatment Plan Update (Adult)  Date:  06/08/2011  Time Reviewed:  11:19 AM   Progress in Treatment: Attending groups: Yes Participating in groups:  Yes Taking medication as prescribed: Yes Tolerating medication:  Yes Family/Significant othe contact made:  Rescinded consent Patient understands diagnosis:  Yes Discussing patient identified problems/goals with staff:  Yes Medical problems stabilized or resolved:  Yes Denies suicidal/homicidal ideation: Yes Issues/concerns per patient self-inventory:  None identified Other: N/A  New problem(s) identified: None Identified  Reason for Continuation of Hospitalization: Stable to d/c  Interventions implemented related to continuation of hospitalization: Stable to d/c  Additional comments: N/A  Estimated length of stay: D/C today  Discharge Plan: Pt will follow up at [facility] in [facility] for medication management and therapy   New goal(s): N/A  Review of initial/current patient goals per problem list:    1.  Goal(s): Reduce depressive symptoms  Met:  Yes   Target date: by discharge  As evidenced by: Reducing depression from a 10 to a 3 as reported by pt. Pt ranks at a 0 today.   2.  Goal (s): Reduce/Eliminate suicidal ideation  Met:  Yes  Target date: by discharge  As evidenced by: pt reporting no SI.  Pt denies SI.    3.  Goal(s): Reduce anxiety symptoms  Met:  No   Target date: by discharge  As evidenced by: Reduce anxiety from a 10 to a 3 as reported by pt. Pt ranks at a 5 today.  Pt does show decrease in anxiety though despite goal not met.     Attendees: Patient:  Nathan Poole 06/08/2011 11:20 AM   Family:     Physician:  Orson Aloe, MD  06/08/2011  11:19 AM   Nursing:   Nanine Means, RN 06/08/2011 11:21 AM   Case Manager:  Reyes Ivan, LCSWA 06/08/2011  11:19 AM   Counselor:  Angus Palms, LCSW 06/08/2011  11:19 AM   Other:  Juline Patch, LCSW 06/08/2011  11:19 AM   Other:  Magdalen Spatz, counseling intern 06/08/2011  11:19 AM   Other:     Other:      Scribe for Treatment Team:   Carmina Miller, 06/08/2011 , 11:19 AM

## 2011-06-08 NOTE — Progress Notes (Signed)
Patient ID: Nathan Poole, male   DOB: May 23, 1990, 21 y.o.   MRN: [id number] Patient excited about discharge this evening, states looking forward to going back to his three jobs. Went over discharge instructions with patient, follow up with [facility] in [facility] and prescriptions. Patient given bus pass and extra money for bus to [facility], patient instructed on which bus stop to go to after discharge. Given supply of medications. Patient denies SI/HI/AV. Verbalizes no questions or concerns upon discharge. States going to stay with mother after discharge.

## 2011-06-08 NOTE — Discharge Summary (Signed)
Physician Discharge Summary Note  Patient:  Nathan Poole is an 21 y.o., male MRN:  [id number] DOB:  01-02-1991 Patient phone:  [phone] (home)  Patient address:   [address] [place] [address],   Date of Admission:  06/03/2011 Date of Discharge: 06/08/11  Reason for Admission: Suicidal ideations.  Discharge Diagnoses: Principal Problem:  *Major depression, recurrent Active Problems:  Borderline behavior   Axis Diagnosis:   AXIS I:  Major Depression, Recurrent severe AXIS II:  Cluster B Traits AXIS III:   Past Medical History  Diagnosis Date  . Asthma    AXIS IV:  problems related to social environment AXIS V:  65  Level of Care:  OP  Hospital Course:  Mr. Delapena is a 21 year old Caucasian male, admitted to [facility] from the [facility] ED. Patient reports, "I was having suicidal thoughts. So I started cutting on my arm with a piece of glass. I had an argument with my family member. It was a bad argument, however, I rather not get into it right now. I have been depressed for the past 6 years and suicidal ideation as long as I have been depressed. While a patient is in this hospital, he received medication management as well as group counseling. And because of the chronic nature of his mental illness, patient was recommended to be transferred to [facility] for more intense psychiatric care. However, patient declined. Patient is known to tell staff and other patients that he has a job that he makes $4, 550 a weeks. Despite the above, patient showed and reports improved mood and decreased suicidal symptoms on daily basis. He also presents with appropriate affect the opposite of sad/depressed affect present on admission. He will follow-up out patient care at [facility] in [place], [place]. Date, time and address of of the clinic provided for patient.  He is being discharged to his home with family. He was provided with a 3 days supply samples of all  his discharged medications and a bus ticket for safe transport to his home provided as well. Patient left [facility] facility in no apparent distress.   Consults:  None  Significant Diagnostic Studies:  labs: Vitamind D levels.  Discharge Vitals:   Blood pressure 107/58, pulse 97, temperature 97.9 F (36.6 C), temperature source Oral, resp. rate 18, height 6\' 6"  (1.981 m), weight 99.791 kg (220 lb).  Mental Status Exam: See Mental Status Examination and Suicide Risk Assessment completed by Attending Physician prior to discharge.  Discharge destination:  Home  Is patient on multiple antipsychotic therapies at discharge:  No   Has Patient had three or more failed trials of antipsychotic monotherapy by history:  No  Recommended Plan for Multiple Antipsychotic Therapies: NA   Medication List  As of 06/08/2011  2:37 PM   START taking these medications         calcium-vitamin D 500-200 MG-UNIT per tablet   Commonly known as: OSCAL WITH D   Take 1 tablet by mouth 2 (two) times daily. For Vitamin D replacement and mood control      citalopram 10 MG tablet   Commonly known as: CELEXA   Take 1 tablet (10 mg total) by mouth daily. For depresion.      cloNIDine 0.1 MG tablet   Commonly known as: CATAPRES   Take 1 tablet (0.1 mg total) by mouth at bedtime and may repeat dose one time if needed. For insomnia      cloNIDine  HCl 0.1 MG Tb12 ER tablet   Commonly known as: KAPVAY   Take 1 tablet (0.1 mg total) by mouth at bedtime. For insomnia      gabapentin 300 MG capsule   Commonly known as: NEURONTIN   Take 1 capsule (300 mg total) by mouth 3 (three) times daily. For mood and anxiety      risperiDONE 0.5 MG tablet   Commonly known as: RISPERDAL   Take 1 tablet (0.5 mg total) by mouth 2 (two) times daily before lunch and supper. For anger control      Vitamin D (Ergocalciferol) 50000 UNITS Caps   Commonly known as: DRISDOL   Take 1 capsule (50,000 Units total) by mouth every Friday. For  Vitamin D replacement and mood control         CHANGE how you take these medications         albuterol 108 (90 BASE) MCG/ACT inhaler   Commonly known as: PROVENTIL HFA;VENTOLIN HFA   Inhale 2 puffs into the lungs 2 (two) times daily as needed for wheezing or shortness of breath. For wheezing   What changed: reasons to take the med      traZODone 100 MG tablet   Commonly known as: DESYREL   Take 2 tablets (200 mg total) by mouth at bedtime. For insomnia   What changed: doctor's instructions         STOP taking these medications         ARIPiprazole 5 MG tablet      divalproex 500 MG 24 hr tablet          Where to get your medications    These are the prescriptions that you need to pick up. We sent them to a specific pharmacy, so you will need to go there to get them.   [facility] - [place], [place] - [phone] AT [facility] [address]    [address] [place] [address]    Phone: [phone]        albuterol 108 (90 BASE) MCG/ACT inhaler   calcium-vitamin D 500-200 MG-UNIT per tablet   citalopram 10 MG tablet   cloNIDine 0.1 MG tablet   cloNIDine HCl 0.1 MG Tb12 ER tablet   gabapentin 300 MG capsule   risperiDONE 0.5 MG tablet   traZODone 100 MG tablet   Vitamin D (Ergocalciferol) 50000 UNITS Caps           Follow-up Information    Follow up with [facility] in [place] on 06/10/2011. (Appointment scheduled at 8:00 am, Auth # [id number])    Contact information:   [address] [place], [place] [address] [phone]         Follow-up recommendations:  Other:  keep all scheduled follow-up appointments.  Comments:  Take all medications as prescribed.  SignedArmandina Stammer I 06/08/2011, 2:37 PM

## 2011-06-08 NOTE — Progress Notes (Signed)
[facility] Group Notes:  (Counselor/Nursing/MHT/Case Management/Adjunct)  06/08/2011 12:09 PM  Type of Therapy:  Group Therapy  Participation Level:  Active  Participation Quality:  Appropriate  Affect:  Appropriate  Cognitive:  Appropriate  Insight:  Good  Engagement in Group:  Good  Engagement in Therapy:  Good  Modes of Intervention:  Support  Summary of Progress/Problems: Nathan Poole actively participated in group, reporting that sky diving and drawing are sources of relaxation for him.    Mendleson, Earlee Herald 06/08/2011, 12:09 PM

## 2011-06-09 NOTE — Progress Notes (Signed)
Patient Discharge Instructions:  Admission Note Faxed,  06/09/2011 After Visit Summary Faxed,  06/09/2011 Faxed to the Next [facility] Care provider:  06/09/2011 D/C Summary Note faxed 06/09/2011 Facesheet faxed 06/09/2011   Faxed to [facility] @ [phone]  Heloise Purpura, Eduard Clos, 06/09/2011, 4:45 PM

## 2012-04-13 ENCOUNTER — Emergency Department (HOSPITAL_COMMUNITY)
Admission: EM | Admit: 2012-04-13 | Discharge: 2012-04-14 | Disposition: A | Discharge status: Home / Self Care | Payer: Self-pay | Attending: Emergency Medicine | Admitting: Emergency Medicine

## 2012-04-13 DIAGNOSIS — F329 Major depressive disorder, single episode, unspecified: Secondary | ICD-10-CM | POA: Insufficient documentation

## 2012-04-13 DIAGNOSIS — Z79899 Other long term (current) drug therapy: Secondary | ICD-10-CM | POA: Insufficient documentation

## 2012-04-13 DIAGNOSIS — S51809A Unspecified open wound of unspecified forearm, initial encounter: Secondary | ICD-10-CM | POA: Insufficient documentation

## 2012-04-13 DIAGNOSIS — F3289 Other specified depressive episodes: Secondary | ICD-10-CM | POA: Insufficient documentation

## 2012-04-13 DIAGNOSIS — Z7289 Other problems related to lifestyle: Secondary | ICD-10-CM

## 2012-04-13 DIAGNOSIS — X789XXA Intentional self-harm by unspecified sharp object, initial encounter: Secondary | ICD-10-CM | POA: Insufficient documentation

## 2012-04-13 DIAGNOSIS — F17200 Nicotine dependence, unspecified, uncomplicated: Secondary | ICD-10-CM | POA: Insufficient documentation

## 2012-04-13 DIAGNOSIS — F319 Bipolar disorder, unspecified: Secondary | ICD-10-CM | POA: Insufficient documentation

## 2012-04-13 DIAGNOSIS — J45909 Unspecified asthma, uncomplicated: Secondary | ICD-10-CM | POA: Insufficient documentation

## 2012-04-13 DIAGNOSIS — F32A Depression, unspecified: Secondary | ICD-10-CM

## 2012-04-13 HISTORY — DX: Bipolar disorder, unspecified: F31.9

## 2012-04-13 HISTORY — DX: Major depressive disorder, single episode, unspecified: F32.9

## 2012-04-13 HISTORY — DX: Depression, unspecified: F32.A

## 2012-04-13 LAB — RAPID URINE DRUG SCREEN, HOSP PERFORMED
Amphetamines: NOT DETECTED
Barbiturates: NOT DETECTED
Benzodiazepines: NOT DETECTED
Tetrahydrocannabinol: POSITIVE — AB

## 2012-04-13 LAB — CBC
MCH: 29.3 pg (ref 26.0–34.0)
MCHC: 34.6 g/dL (ref 30.0–36.0)
Platelets: 246 10*3/uL (ref 150–400)

## 2012-04-13 MED ORDER — CALCIUM CARBONATE-VITAMIN D 500-200 MG-UNIT PO TABS
1.0000 | ORAL_TABLET | Freq: Two times a day (BID) | ORAL | Status: DC
  Administered 2012-04-14: 1 via ORAL
  Filled 2012-04-13 (×4): qty 1

## 2012-04-13 MED ORDER — ZOLPIDEM TARTRATE 5 MG PO TABS: 5.0000 mg | ORAL_TABLET | Freq: Every evening | ORAL | Status: DC | PRN

## 2012-04-13 MED ORDER — IBUPROFEN 600 MG PO TABS: 600.0000 mg | ORAL_TABLET | Freq: Three times a day (TID) | ORAL | Status: DC | PRN

## 2012-04-13 MED ORDER — GABAPENTIN 300 MG PO CAPS
300.0000 mg | ORAL_CAPSULE | Freq: Three times a day (TID) | ORAL | Status: DC
  Administered 2012-04-14: 300 mg via ORAL
  Filled 2012-04-13 (×5): qty 1

## 2012-04-13 MED ORDER — ALBUTEROL SULFATE HFA 108 (90 BASE) MCG/ACT IN AERS: 2.0000 | INHALATION_SPRAY | Freq: Two times a day (BID) | RESPIRATORY_TRACT | Status: DC | PRN

## 2012-04-13 MED ORDER — ONDANSETRON HCL 4 MG PO TABS: 4.0000 mg | ORAL_TABLET | Freq: Three times a day (TID) | ORAL | Status: DC | PRN

## 2012-04-13 MED ORDER — LORAZEPAM 1 MG PO TABS: 1.0000 mg | ORAL_TABLET | Freq: Three times a day (TID) | ORAL | Status: DC | PRN

## 2012-04-13 MED ORDER — RISPERIDONE 0.5 MG PO TABS: 0.5000 mg | ORAL_TABLET | Freq: Two times a day (BID) | ORAL | Status: DC

## 2012-04-13 MED ORDER — VITAMIN D (ERGOCALCIFEROL) 1.25 MG (50000 UNIT) PO CAPS: 50000.0000 [IU] | ORAL_CAPSULE | ORAL | Status: DC

## 2012-04-13 MED ORDER — ALUM & MAG HYDROXIDE-SIMETH 200-200-20 MG/5ML PO SUSP: 30.0000 mL | ORAL | Status: DC | PRN

## 2012-04-13 MED ORDER — CLONIDINE HCL 0.1 MG PO TABS: 0.1000 mg | ORAL_TABLET | Freq: Every evening | ORAL | Status: DC | PRN

## 2012-04-13 MED ORDER — CLONIDINE HCL ER 0.1 MG PO TB12
0.1000 mg | ORAL_TABLET | Freq: Every day | ORAL | Status: DC
Start: 2012-04-13 — End: 2012-04-13
  Filled 2012-04-13: qty 1

## 2012-04-13 MED ORDER — TRAZODONE HCL 100 MG PO TABS
200.0000 mg | ORAL_TABLET | Freq: Every day | ORAL | Status: DC
  Administered 2012-04-14: 200 mg via ORAL
  Filled 2012-04-13: qty 2

## 2012-04-13 MED ORDER — NICOTINE 21 MG/24HR TD PT24: 21.0000 mg | MEDICATED_PATCH | Freq: Every day | TRANSDERMAL | Status: DC

## 2012-04-13 MED ORDER — CITALOPRAM HYDROBROMIDE 10 MG PO TABS
10.0000 mg | ORAL_TABLET | Freq: Every day | ORAL | Status: DC
  Filled 2012-04-13: qty 1

## 2012-04-13 NOTE — ED Provider Notes (Signed)
History     CSN: [id number]  Arrival date & time 04/13/12  2240   First MD Initiated Contact with Patient 04/13/12 2256      Chief Complaint  Patient presents with  . Medical Clearance    (Consider location/radiation/quality/duration/timing/severity/associated sxs/prior treatment) HPI Comments: 21 year old male with history of depression presents to the emergency department with suicidal ideations. Patient was walking on [address] from his friend's house when he found a piece of glass in the ground and began cutting his left arm. He has a history of cutting multiple times before. Patient has been off of his psych medications for the past 2 weeks since he ran out while in town visiting his mom. Patient lives in [place] and is here in [place] visiting his mom. States "my mom said she wanted to see me, however when I get her she wants nothing to do with me". Admits to thoughts of suicide often and his plan is to either suffocate himself or cut himself. He has been admitted multiple times for suicidal ideations. Last tetanus shot last year. Denies drinking alcohol. He is a current everyday smoker and admits to using marijuana 2 days ago. No other drug use.  The history is provided by the patient.    Past Medical History  Diagnosis Date  . Asthma     Past Surgical History  Procedure Date  . Appendectomy     No family history on file.  History  Substance Use Topics  . Smoking status: Smoker, Current Status Unknown -- 2.0 packs/day for 5 years  . Smokeless tobacco: Current User  . Alcohol Use:       Review of Systems  Psychiatric/Behavioral: Positive for suicidal ideas, behavioral problems and self-injury.  All other systems reviewed and are negative.    Allergies  Morphine and related and Penicillins  Home Medications   Current Outpatient Rx  Name  Route  Sig  Dispense  Refill  . ALBUTEROL SULFATE HFA 108 (90 BASE) MCG/ACT IN AERS   Inhalation   Inhale 2  puffs into the lungs 2 (two) times daily as needed for wheezing or shortness of breath. For wheezing   1 Inhaler   0   . CALCIUM CARBONATE-VITAMIN D 500-200 MG-UNIT PO TABS   Oral   Take 1 tablet by mouth 2 (two) times daily. For Vitamin D replacement and mood control   30 tablet   0   . CITALOPRAM HYDROBROMIDE 10 MG PO TABS   Oral   Take 1 tablet (10 mg total) by mouth daily. For depresion.   30 tablet   0   . CLONIDINE HCL 0.1 MG PO TABS   Oral   Take 1 tablet (0.1 mg total) by mouth at bedtime and may repeat dose one time if needed. For insomnia   60 tablet   0   . CLONIDINE HCL ER 0.1 MG PO TB12   Oral   Take 1 tablet (0.1 mg total) by mouth at bedtime. For insomnia   30 tablet   0   . GABAPENTIN 300 MG PO CAPS   Oral   Take 1 capsule (300 mg total) by mouth 3 (three) times daily. For mood and anxiety   90 capsule   0   . RISPERIDONE 0.5 MG PO TABS   Oral   Take 1 tablet (0.5 mg total) by mouth 2 (two) times daily before lunch and supper. For anger control   60 tablet   0   .  TRAZODONE HCL 100 MG PO TABS   Oral   Take 2 tablets (200 mg total) by mouth at bedtime. For insomnia   60 tablet   0   . VITAMIN D (ERGOCALCIFEROL) 50000 UNITS PO CAPS   Oral   Take 1 capsule (50,000 Units total) by mouth every Friday. For Vitamin D replacement and mood control   4 capsule   1     BP 113/66  Pulse 64  Temp 98.7 F (37.1 C) (Oral)  Resp 17  SpO2 96%  Physical Exam  Nursing note and vitals reviewed. Constitutional: He is oriented to person, place, and time. He appears well-developed and well-nourished. No distress.  HENT:  Head: Normocephalic and atraumatic.  Eyes: Conjunctivae normal and EOM are normal. Pupils are equal, round, and reactive to light.  Neck: Normal range of motion. Neck supple.  Cardiovascular: Normal rate, regular rhythm, normal heart sounds and intact distal pulses.   Pulmonary/Chest: Effort normal and breath sounds normal.  Abdominal:  Soft. Bowel sounds are normal. There is no tenderness.  Musculoskeletal: Normal range of motion. He exhibits no edema.  Neurological: He is alert and oriented to person, place, and time.  Skin:       Multiple superficial lacerations to anterior aspect of left forearm. No active bleeding.  Psychiatric: His speech is normal. He is withdrawn. He exhibits a depressed mood. He expresses suicidal ideation. He expresses no homicidal ideation. He expresses suicidal plans.       Flat affect.    ED Course  Procedures (including critical care time)   Labs Reviewed  CBC  COMPREHENSIVE METABOLIC PANEL  URINE RAPID DRUG SCREEN (HOSP PERFORMED)  ETHANOL   No results found.   No diagnosis found.    MDM  21 y/o male with suicidal ideations who cut himself tonight. ACT team consulted and will evaluate patient. Medically cleared.        Trevor Mace, PA-C 04/13/12 2339

## 2012-04-13 NOTE — ED Notes (Signed)
Pt arrives with self inflicted lac to L forearm. Bleeding controlled. Dsg placed on wound by EMS.

## 2012-04-13 NOTE — ED Notes (Signed)
Pt was wanded by Security and two patient bags of belongings where brought to the Nurses station.

## 2012-04-13 NOTE — ED Notes (Signed)
Pt states "I just feel like hurting myself". Pt has a plan. States "to cut myself just enough to bleed out until I pass out." Pt BIB EMS. Pt called EMS to take him to ED. Pt calm, cooperative.

## 2012-04-14 ENCOUNTER — Encounter (HOSPITAL_COMMUNITY): Payer: Self-pay | Admitting: Emergency Medicine

## 2012-04-14 LAB — ETHANOL: Alcohol, Ethyl (B): <11 mg/dL (ref 0–11)

## 2012-04-14 LAB — COMPREHENSIVE METABOLIC PANEL
ALT: 12 U/L (ref 0–53)
AST: 14 U/L (ref 0–37)
Albumin: 4.3 g/dL (ref 3.5–5.2)
Calcium: 10.1 mg/dL (ref 8.4–10.5)
GFR calc Af Amer: >90 mL/min (ref >90)
Sodium: 138 mEq/L (ref 135–145)
Total Protein: 7.9 g/dL (ref 6.0–8.3)

## 2012-04-14 NOTE — ED Provider Notes (Signed)
Pt with hx of cutting since age 21 and traveled from [place] to visit mom and states mom not paying him any attention.  Pt states he has been depressed for years and has passive SI.  Pt denies SI currently but did cut his wrist last night. Pt has been here in the past and admitted to [facility] in 05/2011 for same and was d/ced with med changes.  Pt was evaluated by telepsych who cleared him to go home.  Pt is requesting d/c and has a place to stay.  Denies any SI now.  Gwyneth Sprout, MD 04/14/12 [phone]

## 2012-04-14 NOTE — ED Provider Notes (Signed)
  Medical screening examination/treatment/procedure(s) were performed by non-physician practitioner and as supervising physician I was immediately available for consultation/collaboration.    Lahela Woodin D Chalon Zobrist, MD 04/14/12 0813 

## 2012-04-14 NOTE — ED Notes (Signed)
Pt discharged home. Instructions reviewed. Pt verbalized understanding.
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
## 2012-04-14 NOTE — ED Notes (Signed)
Specialist on call contacted for telepsych 

## 2012-04-14 NOTE — ED Notes (Signed)
Telepsych in progress. 

## 2012-04-15 ENCOUNTER — Emergency Department (HOSPITAL_COMMUNITY)
Admission: EM | Admit: 2012-04-15 | Discharge: 2012-04-16 | Disposition: A | Discharge status: Other Acute Inpatient Hospital | Payer: Self-pay | Attending: Emergency Medicine | Admitting: Emergency Medicine

## 2012-04-15 ENCOUNTER — Encounter (HOSPITAL_COMMUNITY): Payer: Self-pay | Admitting: *Deleted

## 2012-04-15 DIAGNOSIS — F319 Bipolar disorder, unspecified: Secondary | ICD-10-CM | POA: Insufficient documentation

## 2012-04-15 DIAGNOSIS — Y9389 Activity, other specified: Secondary | ICD-10-CM | POA: Insufficient documentation

## 2012-04-15 DIAGNOSIS — F17200 Nicotine dependence, unspecified, uncomplicated: Secondary | ICD-10-CM | POA: Insufficient documentation

## 2012-04-15 DIAGNOSIS — J45909 Unspecified asthma, uncomplicated: Secondary | ICD-10-CM | POA: Insufficient documentation

## 2012-04-15 DIAGNOSIS — S41109A Unspecified open wound of unspecified upper arm, initial encounter: Secondary | ICD-10-CM | POA: Insufficient documentation

## 2012-04-15 DIAGNOSIS — Z79899 Other long term (current) drug therapy: Secondary | ICD-10-CM | POA: Insufficient documentation

## 2012-04-15 DIAGNOSIS — S41112A Laceration without foreign body of left upper arm, initial encounter: Secondary | ICD-10-CM

## 2012-04-15 DIAGNOSIS — Y929 Unspecified place or not applicable: Secondary | ICD-10-CM | POA: Insufficient documentation

## 2012-04-15 DIAGNOSIS — W268XXA Contact with other sharp object(s), not elsewhere classified, initial encounter: Secondary | ICD-10-CM | POA: Insufficient documentation

## 2012-04-15 DIAGNOSIS — F3289 Other specified depressive episodes: Secondary | ICD-10-CM | POA: Insufficient documentation

## 2012-04-15 DIAGNOSIS — F32A Depression, unspecified: Secondary | ICD-10-CM

## 2012-04-15 DIAGNOSIS — F329 Major depressive disorder, single episode, unspecified: Secondary | ICD-10-CM | POA: Insufficient documentation

## 2012-04-15 LAB — RAPID URINE DRUG SCREEN, HOSP PERFORMED: Amphetamines: NOT DETECTED

## 2012-04-15 MED ORDER — QUETIAPINE FUMARATE 100 MG PO TABS
200.0000 mg | ORAL_TABLET | Freq: Two times a day (BID) | ORAL | Status: DC
  Administered 2012-04-15 – 2012-04-16 (×2): 200 mg via ORAL
  Filled 2012-04-15 (×2): qty 2

## 2012-04-15 MED ORDER — TRAZODONE HCL 100 MG PO TABS
100.0000 mg | ORAL_TABLET | Freq: Every day | ORAL | Status: DC
  Administered 2012-04-15: 100 mg via ORAL
  Filled 2012-04-15: qty 1

## 2012-04-15 MED ORDER — DIVALPROEX SODIUM ER 500 MG PO TB24
1000.0000 mg | ORAL_TABLET | Freq: Every day | ORAL | Status: DC
  Administered 2012-04-15: 1000 mg via ORAL
  Filled 2012-04-15 (×2): qty 2

## 2012-04-15 MED ORDER — ACETAMINOPHEN 325 MG PO TABS: 650.0000 mg | ORAL_TABLET | ORAL | Status: DC | PRN

## 2012-04-15 MED ORDER — ARIPIPRAZOLE 5 MG PO TABS
5.0000 mg | ORAL_TABLET | Freq: Every day | ORAL | Status: DC
  Administered 2012-04-15 – 2012-04-16 (×2): 5 mg via ORAL
  Filled 2012-04-15 (×2): qty 1

## 2012-04-15 MED ORDER — RISPERIDONE 0.5 MG PO TABS
0.5000 mg | ORAL_TABLET | Freq: Two times a day (BID) | ORAL | Status: DC
  Administered 2012-04-15 – 2012-04-16 (×2): 0.5 mg via ORAL
  Filled 2012-04-15 (×3): qty 1

## 2012-04-15 MED ORDER — ALUM & MAG HYDROXIDE-SIMETH 200-200-20 MG/5ML PO SUSP: 30.0000 mL | ORAL | Status: DC | PRN

## 2012-04-15 MED ORDER — QUETIAPINE FUMARATE 50 MG PO TABS
50.0000 mg | ORAL_TABLET | Freq: Every day | ORAL | Status: DC
  Administered 2012-04-15: 50 mg via ORAL
  Filled 2012-04-15: qty 1

## 2012-04-15 MED ORDER — NICOTINE 21 MG/24HR TD PT24
21.0000 mg | MEDICATED_PATCH | Freq: Every day | TRANSDERMAL | Status: DC
  Filled 2012-04-15 (×2): qty 1

## 2012-04-15 MED ORDER — ONDANSETRON HCL 4 MG PO TABS: 4.0000 mg | ORAL_TABLET | Freq: Three times a day (TID) | ORAL | Status: DC | PRN

## 2012-04-15 MED ORDER — ALBUTEROL SULFATE HFA 108 (90 BASE) MCG/ACT IN AERS: 2.0000 | INHALATION_SPRAY | Freq: Two times a day (BID) | RESPIRATORY_TRACT | Status: DC | PRN

## 2012-04-15 MED ORDER — IBUPROFEN 200 MG PO TABS: 600.0000 mg | ORAL_TABLET | Freq: Three times a day (TID) | ORAL | Status: DC | PRN

## 2012-04-15 NOTE — ED Provider Notes (Signed)
History     CSN: [id number]  Arrival date & time 04/15/12  2841   First MD Initiated Contact with Patient 04/15/12 1819      Chief Complaint  Patient presents with  . Suicidal  . Extremity Laceration    (Consider location/radiation/quality/duration/timing/severity/associated sxs/prior treatment) The history is provided by the patient.   patient returns for psychiatric complaints. He was seen 2 days ago after he cut himself. He has some depression at that time. He was seen by telepsyc and was discharged home. He is reportedly been off his medications for 2 weeks. He states he's gotten worse. He states things have gone downhill since then. He he states he is depressed and is having some suicidal thoughts. He cut himself on the arm again with some broken glass. He denies substance abuse except marijuana. He does smoke cigarettes. He states he lives in [place] and one of his friends as can help get him there but then did not. Patient states she was not attempting to kill himself he has had suicidal thoughts.   Past Medical History  Diagnosis Date  . Asthma   . Bipolar 1 disorder   . Depression     Past Surgical History  Procedure Date  . Appendectomy     No family history on file.  History  Substance Use Topics  . Smoking status: Smoker, Current Status Unknown -- 2.0 packs/day for 5 years  . Smokeless tobacco: Current User  . Alcohol Use:       Review of Systems  Constitutional: Negative for activity change and appetite change.  HENT: Negative for neck stiffness.   Eyes: Negative for pain.  Respiratory: Negative for chest tightness and shortness of breath.   Cardiovascular: Negative for chest pain and leg swelling.  Gastrointestinal: Negative for nausea, vomiting, abdominal pain and diarrhea.  Genitourinary: Negative for flank pain.  Musculoskeletal: Negative for back pain.  Skin: Negative for rash.  Neurological: Negative for weakness, numbness and headaches.   Psychiatric/Behavioral: Positive for suicidal ideas, self-injury and dysphoric mood. Negative for behavioral problems.    Allergies  Morphine and related and Penicillins  Home Medications   Current Outpatient Rx  Name  Route  Sig  Dispense  Refill  . ALBUTEROL SULFATE HFA 108 (90 BASE) MCG/ACT IN AERS   Inhalation   Inhale 2 puffs into the lungs 2 (two) times daily as needed for wheezing or shortness of breath. For wheezing   1 Inhaler   0   . ARIPIPRAZOLE 5 MG PO TABS   Oral   Take 5 mg by mouth daily.         Marland Kitchen DIVALPROEX SODIUM ER 500 MG PO TB24   Oral   Take 1,000 mg by mouth at bedtime.         Marland Kitchen QUETIAPINE FUMARATE 200 MG PO TABS   Oral   Take 200 mg by mouth 2 (two) times daily. Take with 50 mg         . QUETIAPINE FUMARATE 50 MG PO TABS   Oral   Take 50 mg by mouth at bedtime. Take with 200 mg         . RISPERIDONE 0.5 MG PO TABS   Oral   Take 0.5 mg by mouth 2 (two) times daily.         . TRAZODONE HCL 100 MG PO TABS   Oral   Take 100 mg by mouth at bedtime.  BP 145/72  Pulse 109  Temp 98.5 F (36.9 C)  Resp 20  SpO2 100%  Physical Exam  Constitutional: He is oriented to person, place, and time. He appears well-developed and well-nourished.  HENT:  Head: Normocephalic.  Eyes: Pupils are equal, round, and reactive to light.  Neck: Neck supple.  Cardiovascular: Normal rate and regular rhythm.   Pulmonary/Chest: Effort normal.  Abdominal: Soft. There is no tenderness.  Musculoskeletal:       Superficial horizontal lacerations to volar and aspect of left forearm. Neurovascularly intact distally.  Neurological: He is alert and oriented to person, place, and time.  Skin: Skin is warm.  Psychiatric:       Depressed    ED Course  Procedures (including critical care time)  Labs Reviewed  URINE RAPID DRUG SCREEN (HOSP PERFORMED) - Abnormal; Notable for the following:    Tetrahydrocannabinol POSITIVE (*)     All other  components within normal limits   No results found.   1. Depression   2. Lacerations of multiple sites of left arm       MDM  Patient with superficial  lacerations of his left forearm. None appear to need suturing. He is bipolar and has a history of cutting. He was seen 2 days ago for the same. This time he may need inpatient treatment. She will be seen by the ACT team.        Juliet Rude. Rubin Payor, MD 04/15/12 1919
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
## 2012-04-15 NOTE — ED Notes (Signed)
Per EMS pt reports being homeless, [place]. Pt has two superficial laceration to left wrist. Pt reports SI, states has been out of medications x 2 weeks. States he cuts every time he runs out of medications. Was seen here for SI two days ago. VSS.

## 2012-04-16 ENCOUNTER — Encounter (HOSPITAL_COMMUNITY): Payer: Self-pay | Admitting: *Deleted

## 2012-04-16 ENCOUNTER — Inpatient Hospital Stay (HOSPITAL_COMMUNITY)
Admission: RE | Admit: 2012-04-16 | Discharge: 2012-04-22 | DRG: 885 | Disposition: A | Discharge status: Home / Self Care | Payer: Federal, State, Local not specified - Other | Source: Other Acute Inpatient Hospital | Attending: Psychiatry | Admitting: Psychiatry

## 2012-04-16 DIAGNOSIS — F17200 Nicotine dependence, unspecified, uncomplicated: Secondary | ICD-10-CM | POA: Diagnosis present

## 2012-04-16 DIAGNOSIS — J45909 Unspecified asthma, uncomplicated: Secondary | ICD-10-CM | POA: Diagnosis present

## 2012-04-16 DIAGNOSIS — F339 Major depressive disorder, recurrent, unspecified: Principal | ICD-10-CM | POA: Diagnosis present

## 2012-04-16 DIAGNOSIS — Z79899 Other long term (current) drug therapy: Secondary | ICD-10-CM

## 2012-04-16 DIAGNOSIS — F1210 Cannabis abuse, uncomplicated: Secondary | ICD-10-CM | POA: Diagnosis present

## 2012-04-16 MED ORDER — CITALOPRAM HYDROBROMIDE 20 MG PO TABS
20.0000 mg | ORAL_TABLET | Freq: Every day | ORAL | Status: DC
  Administered 2012-04-16: 20 mg via ORAL
  Filled 2012-04-16: qty 1

## 2012-04-16 MED ORDER — TRAZODONE HCL 100 MG PO TABS
100.0000 mg | ORAL_TABLET | Freq: Every day | ORAL | Status: DC
  Filled 2012-04-16 (×4): qty 1

## 2012-04-16 MED ORDER — ALUM & MAG HYDROXIDE-SIMETH 200-200-20 MG/5ML PO SUSP: 30.0000 mL | ORAL | Status: DC | PRN

## 2012-04-16 MED ORDER — ACETAMINOPHEN 325 MG PO TABS
650.0000 mg | ORAL_TABLET | Freq: Four times a day (QID) | ORAL | Status: DC | PRN
  Administered 2012-04-17 – 2012-04-22 (×8): 650 mg via ORAL

## 2012-04-16 MED ORDER — MAGNESIUM HYDROXIDE 400 MG/5ML PO SUSP: 30.0000 mL | Freq: Every day | ORAL | Status: DC | PRN

## 2012-04-16 NOTE — Tx Team (Signed)
Initial Interdisciplinary Treatment Plan  PATIENT STRENGTHS: (choose at least two) Ability for insight Average or above average intelligence Communication skills Physical Health  PATIENT STRESSORS: Financial difficulties Marital or family conflict Medication change or noncompliance   PROBLEM LIST: Problem List/Patient Goals Date to be addressed Date deferred Reason deferred Estimated date of resolution  SI - self harm 04/16/2012     Depression 04/16/2012     Homeless, lack of support 04/16/2012                                          DISCHARGE CRITERIA:  Adequate post-discharge living arrangements Improved stabilization in mood, thinking, and/or behavior Reduction of life-threatening or endangering symptoms to within safe limits Verbal commitment to aftercare and medication compliance  PRELIMINARY DISCHARGE PLAN: Outpatient therapy Return to previous living arrangement Return to previous work or school arrangements  PATIENT/FAMIILY INVOLVEMENT: This treatment plan has been presented to and reviewed with the patient, Nathan Poole, and/or family member.  The patient and family have been given the opportunity to ask questions and make suggestions.  Nathan Poole [facility] 04/16/2012, 11:54 PM

## 2012-04-16 NOTE — ED Notes (Signed)
Pt info faxed to [facility].

## 2012-04-16 NOTE — BHH Counselor (Signed)
Telepsych recommended inpatient treatment

## 2012-04-16 NOTE — ED Provider Notes (Addendum)
Sleeping, no distress, vitals stable.  Awaiting ACT evaluation.  BP 118/71  Pulse 54  Temp 97.5 F (36.4 C) (Oral)  Resp 18  SpO2 97%   Glynn Octave, MD 04/16/12 [phone]  Psychiatry consult complete with Dr. Leretha Pol. Patient is not psychiatrically stable. Celexa 20 mg daily, Abilify 5 mg daily recommended.  Glynn Octave, MD 04/16/12 1351  Patient accepted at behavioral health hospital by Dr. Luciana Axe.  BP 98/57  Pulse 57  Temp 98 F (36.7 C) (Oral)  Resp 18  SpO2 97%   Glynn Octave, MD 04/16/12 1454

## 2012-04-16 NOTE — ED Notes (Signed)
Per [facility], patient has been accepted by Dr. Luciana Axe for inpatient psych treatment. Transfer is scheduled for 3:30pm   Room Assignment:401-1   Writer will notify pt's RN and MD of disposition.  Janann Colonel., MSW, [facility] Clinical Social Worker [phone]
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
## 2012-04-17 ENCOUNTER — Encounter (HOSPITAL_COMMUNITY): Payer: Self-pay | Admitting: Psychiatry

## 2012-04-17 DIAGNOSIS — F3289 Other specified depressive episodes: Secondary | ICD-10-CM

## 2012-04-17 DIAGNOSIS — F329 Major depressive disorder, single episode, unspecified: Secondary | ICD-10-CM

## 2012-04-17 MED ORDER — ARIPIPRAZOLE 5 MG PO TABS
5.0000 mg | ORAL_TABLET | Freq: Every day | ORAL | Status: DC
  Administered 2012-04-17 – 2012-04-20 (×4): 5 mg via ORAL
  Filled 2012-04-17 (×8): qty 1

## 2012-04-17 MED ORDER — NICOTINE POLACRILEX 2 MG MT GUM
2.0000 mg | CHEWING_GUM | OROMUCOSAL | Status: DC | PRN
  Administered 2012-04-17 – 2012-04-22 (×6): 2 mg via ORAL
  Filled 2012-04-17 (×2): qty 1

## 2012-04-17 MED ORDER — TRAZODONE HCL 100 MG PO TABS
150.0000 mg | ORAL_TABLET | Freq: Every day | ORAL | Status: DC
  Administered 2012-04-17 – 2012-04-21 (×4): 150 mg via ORAL
  Filled 2012-04-17 (×7): qty 1
  Filled 2012-04-17: qty 21

## 2012-04-17 MED ORDER — NICOTINE POLACRILEX 2 MG MT GUM
CHEWING_GUM | OROMUCOSAL | Status: AC
  Administered 2012-04-17: 16:00:00
  Filled 2012-04-17: qty 1
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
## 2012-04-17 NOTE — Progress Notes (Signed)
Met with pt 1:1 who remains flat and blunted in affect with anxious and depressed mood. Rates his depression at a 7/10 with 10 being the best mood. He does report continued anxiety as to how he will return home to [place]. Denies SI or urges to cut. No HI. Attended group and interacted appropriately. Supported and encouraged. Reminded pt trazadone is available tonight as he indicates poor sleep last night. Pt receptive. Currently playing cards in dayroom with peers. Denies AVH at present. Lawrence Marse[place]

## 2012-04-17 NOTE — Progress Notes (Signed)
Psychoeducational Group Note  Date:  04/17/2012 Time:  2000  Group Topic/Focus:  Wrap-Up Group:   The focus of this group is to help patients review their daily goal of treatment and discuss progress on daily workbooks.  Participation Level:  Active  Participation Quality:  Appropriate  Affect:  Appropriate  Cognitive:  Appropriate  Insight:  Engaged  Engagement in Group:  Developing/Improving  Additional Comments:  Patient attended and participated in group he reports that he went to group today. He socialized with peers, went down to meals. He advised that he has no support here. He will be returning back home when he leaves here.  Lita Mains [facility] 04/17/2012, 8:48 PM

## 2012-04-17 NOTE — Progress Notes (Signed)
Patient ID: Nathan Poole, male   DOB: Jan 11, 1991, 21 y.o.   MRN: [id number] 04-17-12 @ 1523 nursing shift note: D: pt has been visible in the dayroom and participating in the milieu. A: he was started on abilify and staff will observe him for any adverse effects. He stated prior to this admission he had been non compliant with his medications for 2 weeks. He stated he only slept 4 hrs. He stated he uses mj. R: no adverse effect from medications noted. staff will continue to monitor and q 15 min cks continue.

## 2012-04-17 NOTE — Progress Notes (Signed)
Psychoeducational Group Note  Date:  04/17/2012 Time:  0930am  Group Topic/Focus:  Making Healthy Choices:   The focus of this group is to help patients identify negative/unhealthy choices they were using prior to admission and identify positive/healthier coping strategies to replace them upon discharge.  Participation Level:  Minimal  Participation Quality:  Inattentive  Affect:  Blunted  Cognitive:  Appropriate  Insight:  Limited  Engagement in Group:  Limited  Additional Comments:  Inventory group   Nathan Poole 04/17/2012,9:32 AM
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
## 2012-04-17 NOTE — Clinical Social Work Note (Signed)
[facility] Group Notes:  (Clinical Social Work)  04/17/2012   11:15-11:45AM  Summary of Progress/Problems:  The main focus of today's process group was to listen to a variety of genres of music and to identify that different types of music provoke different responses.  The patient then was able to identify personally what was soothing for them, as well as energizing.  Examples were given of how to use this knowledge in sleep habits, with depression, and with other symptoms.  The patient expressed that he also uses art as a means of dealing with his emotions.  Type of Therapy:  Music Therapy with processing done  Participation Level:  Active  Participation Quality:  Attentive  Affect:  Blunted  Cognitive:  Appropriate  Insight:  Engaged  Engagement in Therapy:  Engaged  Modes of Intervention:   Socialization, Support and Processing, Exploration, Education, Rapport Building   [employer], LCSW 04/17/2012, 12:24 PM

## 2012-04-17 NOTE — H&P (Signed)
  Pt was seen by me today. Will continue current meds.  The detailed H&P note will be done by mid level (NP/PA).  

## 2012-04-18 LAB — LIPID PANEL
Cholesterol: 154 mg/dL (ref 0–200)
HDL: 49 mg/dL (ref >39)
LDL Cholesterol: 85 mg/dL (ref 0–99)
Triglycerides: 100 mg/dL (ref <150)

## 2012-04-18 NOTE — Progress Notes (Signed)
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 Asthma   . Bipolar 1 disorder   . Depression    Axis IV: economic problems, housing problems, occupational problems, other psychosocial or environmental problems, problems related to social environment and problems with primary support group Axis V: 41-50 serious symptoms  ADL's:  Intact  Sleep: Good  Appetite:  Good  Suicidal Ideation:  Denies Homicidal Ideation:  Denies  Psychiatric Specialty Exam: Review of Systems  Constitutional: Negative.   HENT: Negative.   Eyes: Negative.   Respiratory: Negative.   Cardiovascular: Negative.   Gastrointestinal: Negative.   Genitourinary: Negative.   Musculoskeletal: Negative.   Skin: Negative.   Neurological: Negative.   Endo/Heme/Allergies: Negative.   Psychiatric/Behavioral: Positive for depression.    Blood pressure 94/54, pulse 98, temperature 97.6 F (36.4 C), temperature source Oral, resp. rate 20, height 6\' 5"  (1.956 m), weight 92.987 kg (205 lb).Body mass index is 24.31 kg/(m^2).  General Appearance: Casual  Eye Contact::  Fair  Speech:  Normal Rate  Volume:  Normal  Mood:  Anxious  Affect:  Flat  Thought Process:  Coherent  Orientation:  Full (Time, Place, and Person)  Thought Content:  WDL  Suicidal Thoughts:  No  Homicidal Thoughts:  No  Memory:  Immediate;   Fair Recent;   Fair Remote;   Fair  Judgement:  Fair  Insight:  Fair  Psychomotor Activity:  Normal  Concentration:  Fair  Recall:  Fair  Akathisia:  No  Handed:  Ambidextrous  AIMS (if indicated):     Assets:  Communication Skills Desire for Improvement  Sleep:  Number of Hours: 6.5    Current Medications: Current Facility-Administered Medications  Medication Dose  Route Frequency Provider Last Rate Last Dose  . acetaminophen (TYLENOL) tablet 650 mg  650 mg Oral Q6H PRN Wonda Cerise, MD   650 mg at 04/17/12 2242  . alum & mag hydroxide-simeth (MAALOX/MYLANTA) 200-200-20 MG/5ML suspension 30 mL  30 mL Oral Q4H PRN Wonda Cerise, MD      . ARIPiprazole (ABILIFY) tablet 5 mg  5 mg Oral Daily Wonda Cerise, MD   5 mg at 04/18/12 0839  . magnesium hydroxide (MILK OF MAGNESIA) suspension 30 mL  30 mL Oral Daily PRN Wonda Cerise, MD      . nicotine polacrilex (NICORETTE) gum 2 mg  2 mg Oral PRN Verne Spurr, PA-C   2 mg at 04/17/12 1543  . traZODone (DESYREL) tablet 150 mg  150 mg Oral QHS Nanine Means, NP   150 mg at 04/17/12 2243    Lab Results:  Results for orders placed during the hospital encounter of 04/16/12 (from the past 48 hour(s))  LIPID PANEL     Status: Normal   Collection Time   04/18/12  6:37 AM      Component Value Range Comment   Cholesterol 154  0 - 200 mg/dL    Triglycerides 409  <811 mg/dL    HDL 49  >91 mg/dL    Total CHOL/HDL Ratio 3.1      VLDL 20  0 - 40 mg/dL    LDL Cholesterol 85  0 - 99 mg/dL   HEMOGLOBIN Y7W     Status: Normal   Collection Time   04/18/12  6:37 AM      Component Value Range Comment   Hemoglobin A1C 5.3  <5.7 %    Mean Plasma Glucose 105  <117 mg/dL     Physical Findings: AIMS: Facial and Oral Movements Muscles of Facial Expression: None, normal Lips and Perioral Area: None, normal Jaw: None, normal Tongue: None, normal,Extremity Movements Upper (arms, wrists, hands, fingers): None, normal Lower (legs, knees, ankles, toes): None, normal, Trunk Movements Neck, shoulders, hips: None, normal, Overall Severity Severity of abnormal movements (highest score from questions above): None, normal Incapacitation due to abnormal movements: None, normal Patient's awareness of abnormal movements (rate only patient's report): No Awareness, Dental Status Current problems with teeth and/or dentures?: No Does patient usually  wear dentures?: No  CIWA:    COWS:     Treatment Plan Summary: Daily contact with patient to assess and evaluate symptoms and progress in treatment Medication management  Plan:  Reviewed chart, medications, notes, and vital signs. 1-Individual and group therapy 2-Medication management for depression and anxiety 3-Social worker assistance with living situation 4-Treatment plan for discharge to prevent relapse  Medical Decision Making Problem Points:  Established problem, stable/improving (1) and Review of psycho-social stressors (1) Data Points:  Review of medication regiment & side effects (2)  I certify that inpatient services furnished can reasonably be expected to improve the patient's condition.   Nanine Means, PMH-NP 04/18/2012, 11:47 AM

## 2012-04-18 NOTE — Progress Notes (Signed)
[facility] LCSW Aftercare Discharge Planning Group Note  04/18/2012 10:30 AM  Participation Quality:  Did not attend    [name] B 04/18/2012, 10:30 AM
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
## 2012-04-18 NOTE — Clinical Social Work Note (Signed)
  Type of Therapy: Process Group Therapy  Participation Level:  Active  Participation Quality:  Appropriate  Affect:  Appropriate  Cognitive:  Appropriate  Insight:  Improving  Engagement in Group:  Engaged  Engagement in Therapy:  Improving  Modes of Intervention:  Activity, Clarification, Education, Problem-solving and Support  Summary of Progress/Problems: Today's group addressed the issue of overcoming obstacles.  Patients were asked to identify their biggest obstacle post d/c that stands in the way of their on-going success, and then problem solve as to how to manage this.  Nathan Poole stated his biggest obstacle is people who get in his personal space, and when questioned admitted he was talking about his mother and how the 2 of them have difficulty getting along.  In fact, he plans to stay in the shelter rather than return there.       Nathan Poole B 04/18/2012   5:11 PM

## 2012-04-18 NOTE — Treatment Plan (Signed)
Interdisciplinary Treatment Plan Update (Adult)  Date: 04/18/2012  Time Reviewed: 10:30 AM   Progress in Treatment: Attending groups: Yes Participating in groups: Yes Taking medication as prescribed: Yes Tolerating medication: Yes   Family/Significant other contact made:  No Patient understands diagnosis:  Yes  As evidenced by asking for help with depression and psychosis Discussing patient identified problems/goals with staff:  Yes  See below Medical problems stabilized or resolved:  Yes Denies suicidal/homicidal ideation: Yes  In tx team Issues/concerns per patient self-inventory:  Not filled out Other:  New problem(s) identified: N/A  Reason for Continuation of Hospitalization: Depression Hallucinations Medication stabilization  Interventions implemented related to continuation of hospitalization: Abilify, Trazodone trial  Encourage group attendance and participation  Additional comments:  Estimated length of stay: 2-4 days  Discharge Plan:unknown  New goal(s): N/A  Review of initial/current patient goals per problem list:   1.  Goal(s):Eliminate SI  Met:  Yes  Target date:12/16  As evidenced [id number] report  2.  Goal (s): Stabilize mood through the use of medication and therapeutic milieu  Met:  No  Target date:12/19  As evidenced [id number] will rate his depression at a 3 or less on a 10 scale  3.  Goal(s): Eliminate psychosis  Met:  No  Target date:12/19  As evidenced [id number] will report that he no longer hears voices at night  4.  Goal(s): Identify dispositional plan  Met:  No  Target date:12/19  As evidenced [id number] will likely stay at the shelter at d/c, but is still uncertain  Attendees: Patient:     Family:     Physician:  Thedore Mins 04/18/2012 10:30 AM   Nursing:    04/18/2012 10:30 AM   Clinical Social Worker:  Richelle Ito 04/18/2012 10:30 AM   Extender:   04/18/2012 10:30 AM   Other:     Other:     Other:      Other:      Scribe for Treatment Team:   Ida Rogue, 04/18/2012 10:30 AM

## 2012-04-18 NOTE — Progress Notes (Signed)
The focus of this group is to help patients review their daily goal of treatment and discuss progress on daily workbooks. Pt attended the evening group session and participated in the discussion. Pt reported feeling good about getting his mediations sorted out and thinks this will help him going forward.

## 2012-04-19 DIAGNOSIS — F339 Major depressive disorder, recurrent, unspecified: Principal | ICD-10-CM

## 2012-04-19 NOTE — Progress Notes (Signed)
Seen and agreed. Kayvon Mo, MD 
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
## 2012-04-19 NOTE — Progress Notes (Signed)
Pt continues to c/o lower back pain. Pt given prn pain med. Pt reported that the pain is coming from sleeping on the bed. Pt requested to program on the 500 hall because he's not benefiting from groups on 400 hall.

## 2012-04-19 NOTE — Progress Notes (Signed)
Psychoeducational Group Note  Date:  04/19/2012 Time:  0930 Group Topic/Focus:  Recovery Goals:   The focus of this group is to identify appropriate goals for recovery and establish a plan to achieve them.  Participation Level:  Minimal  Participation Quality:  Appropriate  Affect:  Appropriate  Cognitive:  Oriented  Insight:  Off Topic  Engagement in Group:  Limited  Additional Comments:  Pt stated that he would like to program on the five hundred hall, because the four hundred hall groups are not "doing anything for me."  Rhealyn Cullen E 04/19/2012, 11:07 AM

## 2012-04-19 NOTE — Progress Notes (Signed)
Pt denies SI/HI/AVH. Pt reports being anxious. Pt in hallway pacing. Pt reported that he has no family to depend on for support. Pt reported that his mom lives here in [place] but does not like him and has not cared for him since he was 21 years old. Pt reported that he has four siblings in the military and two siblings who he don't know where they are located. Pt reported that he came to [facility] to see a friend via plane from [place] and now he is unable to get back home. Pt appears worried. Pt anxious this morning, pacing in hallway. Attend groups. Engaged on the unit with others. At 10:00 am pt in room sitting on floor with arms folded appearing depressed. Pt was asked what was bothering him and pt requested to be alone. Pt was asked if he felt SI and pt reported "no".  Medications given as ordered per MD. Verbal support given. Pt encouraged to attend groups. 15 minute checks performed for safety.  Worried about placement after discharge. Depressed affect.

## 2012-04-19 NOTE — Progress Notes (Signed)
[facility] MD Progress Note  04/19/2012 2:58 PM Nathan Poole  MRN:  [id number] Subjective:  "I'm doing Ok." Patient states he would like to program on the 500 unit.  His history of how he came to be here is inconsistant and he is unreliable as a historian. Diagnosis:  MDD recurrent  ADL's:  Intact  Sleep: Poor but patient states "pretty good" with 2.75 hours documented.  Appetite:  Good  Suicidal Ideation:  denies Homicidal Ideation:  denies AEB (as evidenced by): Patient's verbal response to inquiry, affect, and written response on daily self assessments.  Psychiatric Specialty Exam: Review of Systems  Constitutional: Negative.  Negative for fever, chills, weight loss, malaise/fatigue and diaphoresis.  HENT: Negative for congestion and sore throat.   Eyes: Negative for blurred vision, double vision and photophobia.  Respiratory: Negative for cough, shortness of breath and wheezing.   Cardiovascular: Negative for chest pain, palpitations and PND.  Gastrointestinal: Negative for heartburn, nausea, vomiting, abdominal pain, diarrhea and constipation.  Musculoskeletal: Negative for myalgias, joint pain and falls.  Neurological: Negative for dizziness, tingling, tremors, sensory change, speech change, focal weakness, seizures, loss of consciousness, weakness and headaches.  Endo/Heme/Allergies: Negative for polydipsia. Does not bruise/bleed easily.  Psychiatric/Behavioral: Negative for depression, suicidal ideas, hallucinations, memory loss and substance abuse. The patient is not nervous/anxious and does not have insomnia.     Blood pressure 126/78, pulse 83, temperature 97.3 F (36.3 C), temperature source Oral, resp. rate 16, height 6\' 5"  (1.956 m), weight 92.987 kg (205 lb).Body mass index is 24.31 kg/(m^2).  General Appearance: Fairly Groomed  Patent attorney::  Good  Speech:  Clear and Coherent  Volume:  Normal  Mood:  Anxious and Depressed engaging at first then tearful and puts his  head down.  Affect:  Congruent  Thought Process:  Circumstantial and Goal Directed His version of education does not add up.  Patient appears and acts limited. States he was in AP classes and skipped the 8th grade but dropped out at 18 while in the 11th. Denies failing any grades.   Orientation:  Other:  "I just want to go home."  Thought Content:  WDL states he has a job in [employer]" working for [employer]  Suicidal Thoughts:  No  Homicidal Thoughts:  No  Memory:  Immediate;   Poor Recent;   Poor Remote;   Poor  Judgement:  Impaired  Insight:  Lacking  Psychomotor Activity:  Normal  Concentration:  Poor  Recall:  Poor  Akathisia:  No  Handed:    AIMS (if indicated):     Assets:  Desire for Improvement  Sleep:  Number of Hours: 2.75    Current Medications: Current Facility-Administered Medications  Medication Dose Route Frequency Provider Last Rate Last Dose  . acetaminophen (TYLENOL) tablet 650 mg  650 mg Oral Q6H PRN Wonda Cerise, MD   650 mg at 04/19/12 0823  . alum & mag hydroxide-simeth (MAALOX/MYLANTA) 200-200-20 MG/5ML suspension 30 mL  30 mL Oral Q4H PRN Wonda Cerise, MD      . ARIPiprazole (ABILIFY) tablet 5 mg  5 mg Oral Daily Wonda Cerise, MD   5 mg at 04/19/12 4696  . magnesium hydroxide (MILK OF MAGNESIA) suspension 30 mL  30 mL Oral Daily PRN Wonda Cerise, MD      . nicotine polacrilex (NICORETTE) gum 2 mg  2 mg Oral PRN Verne Spurr, PA-C   2 mg at 04/17/12 1543  . traZODone (DESYREL) tablet 150 mg  150 mg  Oral QHS Nanine Means, NP   150 mg at 04/17/12 2243    Lab Results:  Results for orders placed during the hospital encounter of 04/16/12 (from the past 48 hour(s))  LIPID PANEL     Status: Normal   Collection Time   04/18/12  6:37 AM      Component Value Range Comment   Cholesterol 154  0 - 200 mg/dL    Triglycerides 161  <096 mg/dL    HDL 49  >04 mg/dL    Total CHOL/HDL Ratio 3.1      VLDL 20  0 - 40 mg/dL    LDL Cholesterol 85  0 - 99 mg/dL   HEMOGLOBIN  V4U     Status: Normal   Collection Time   04/18/12  6:37 AM      Component Value Range Comment   Hemoglobin A1C 5.3  <5.7 %    Mean Plasma Glucose 105  <117 mg/dL     Physical Findings: AIMS: Facial and Oral Movements Muscles of Facial Expression: None, normal Lips and Perioral Area: None, normal Jaw: None, normal Tongue: None, normal,Extremity Movements Upper (arms, wrists, hands, fingers): None, normal Lower (legs, knees, ankles, toes): None, normal, Trunk Movements Neck, shoulders, hips: None, normal, Overall Severity Severity of abnormal movements (highest score from questions above): None, normal Incapacitation due to abnormal movements: None, normal Patient's awareness of abnormal movements (rate only patient's report): No Awareness, Dental Status Current problems with teeth and/or dentures?: No Does patient usually wear dentures?: No  CIWA:    COWS:     Treatment Plan Summary: Daily contact with patient to assess and evaluate symptoms and progress in treatment Medication management  Plan: 1. Patient refuses to allow [place] to consult his family for further information. 2. He is an unreliable historian.  Medical Decision Making Problem Points:  Review of psycho-social stressors (1) Data Points:  Review and summation of old records (2)  I certify that inpatient services furnished can reasonably be expected to improve the patient's condition.  Rona Ravens. Unika Nazareno PAC 04/19/2012, 2:58 PM

## 2012-04-19 NOTE — Progress Notes (Signed)
Psychoeducational Group Note  Date:  04/19/2012 Time:  2000  Group Topic/Focus:  Wrap-Up Group:   The focus of this group is to help patients review their daily goal of treatment and discuss progress on daily workbooks.  Participation Level:  Active  Participation Quality:  Appropriate  Affect:  Appropriate  Cognitive:  Appropriate  Insight:  Engaged  Engagement in Group:  Engaged  Additional Comments:  Pt attended wrap-up group this evening. Pt stated that he found placement once he leaves. Pt goals for tomorrow are working towards discharge.   Arabela Basaldua A 04/19/2012, 9:43 PM

## 2012-04-19 NOTE — Progress Notes (Signed)
Patient ID: Nathan Poole, male   DOB: 01/16/1991, 21 y.o.   MRN: [id number]  D: Pt denies SI/HI/AVH. Pt is pleasant and cooperative.Pt complains of lower back pain 8 out of 10. Pt seems very anxious and depressed. Pt appears to be pre-occupied with something on his mind, but blocks when asked about it.   A: Pt was offered support and encouragement. Pt was given scheduled medications. Pt was encourage to attend groups. Q 15 minute checks were done for safety. Pt given tylenol for low back pain.    R:Pt attends groups and interacts well with peers and staff. Pt is taking medication. Pt receptive to treatment and safety maintained on unit. Pt sleep upon pain re-assessment.

## 2012-04-19 NOTE — Progress Notes (Signed)
[facility] LCSW Group Therapy  04/19/2012 4:17 PM  Type of Therapy:  Group Therapy  Participation Level:  Active  Participation Quality:  Attentive  Affect:  Appropriate  Cognitive:  Alert  Insight:  Limited  Engagement in Therapy:  Engaged  Modes of Intervention:  Discussion, Exploration and Socialization  Summary of Progress/Problems: Nathan Poole participated in the guided imagery activity.  He stated he was tuned in for about half of it.  Nathan Poole he had already done the same earlier in the day, and described how he gets a song in his mind-today it was Nathan Poole goes through the whole song in his mind and it relaxes him.  Also talked about Christmas and how it is a positive time for him-plans to get together with friends, call his mom, and skype with grandparents.  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  Nathan Poole 04/19/2012, 4:17 PM

## 2012-04-20 DIAGNOSIS — F603 Borderline personality disorder: Secondary | ICD-10-CM

## 2012-04-20 MED ORDER — RISPERIDONE 1 MG PO TBDP
1.0000 mg | ORAL_TABLET | Freq: Every day | ORAL | Status: DC
  Administered 2012-04-20 – 2012-04-21 (×2): 1 mg via ORAL
  Filled 2012-04-20: qty 14
  Filled 2012-04-20 (×4): qty 1

## 2012-04-20 NOTE — Progress Notes (Signed)
Psychoeducational Group Note  Date:  04/20/2012 Time:  1100  Group Topic/Focus:  Healthy Communication:   The focus of this group is to discuss communication, barriers to communication, as well as healthy ways to communicate with others.  Participation Level:  Active  Participation Quality:  Appropriate  Affect:  Appropriate  Cognitive:  Appropriate  Insight:  Engaged  Engagement in Group:  Engaged  Additional Comments: pt was appropriate in answering questions while attending the MHT group.  Rosine Solecki A 04/20/2012, 12:58 PM

## 2012-04-20 NOTE — Progress Notes (Signed)
[facility] LCSW Group Therapy  04/20/2012 4:13 PM  Type of Therapy:  [facility] [employer]  Participation Level:  Did Not Attend    Ida Rogue 04/20/2012, 4:13 PM

## 2012-04-20 NOTE — Progress Notes (Signed)
Patient ID: Nathan Poole, male   DOB: 1990-06-21, 21 y.o.   MRN: [id number] D: Pt. In day room watching TV.  Pt. Says "I'm ready to move or ready to go home", "that medicine they had me on cost "300, I can't afford that so they was suppose to change it to something else". A: Writer reviewed meds change with pt. Staff will monitor q42min for safety. Writer encouraged group. R: pt. Is safe on the unit. Pt. Programmed on 500 hall as ordered. Pt. Notes he will be compliant with meds he can afford.

## 2012-04-20 NOTE — Progress Notes (Signed)
[facility] LCSW Aftercare Discharge Planning Group Note  04/20/2012 4:14 PM  Participation Quality:  Attentive  Affect:  Appropriate  Cognitive:  Appropriate  Insight:  Limited  Engagement in Group:  Engaged  Modes of Intervention:  Discussion, Exploration and Socialization  Summary of Progress/Problems: Talked to Nathan Poole about needing to change his medications today as he is unable to afford Abilify.  He acknowledge this, saying that his copay is $300.00.  States he if fine with trying a different medication.  Inquires again about transfer to 500 hall.  Nathan Poole B 04/20/2012, 4:14 PM

## 2012-04-20 NOTE — Progress Notes (Addendum)
[facility] MD Progress Note  04/20/2012 3:01 PM Nathan Poole  MRN:  [id number] Subjective:  "Are you gonna change my medications?" Diagnosis:  MDD recurrent                      BPD  ADL's:  Intact  Sleep: Good  Appetite:  Good  Suicidal Ideation:  denies Homicidal Ideation:  denies AEB (as evidenced by): Patient's verbal response, affect and written reports on daily self inventory.  Psychiatric Specialty Exam: Review of Systems  Constitutional: Negative.  Negative for fever, chills, weight loss, malaise/fatigue and diaphoresis.  HENT: Negative for congestion and sore throat.   Eyes: Negative for blurred vision, double vision and photophobia.  Respiratory: Negative for cough, shortness of breath and wheezing.   Cardiovascular: Negative for chest pain, palpitations and PND.  Gastrointestinal: Negative for heartburn, nausea, vomiting, abdominal pain, diarrhea and constipation.  Musculoskeletal: Negative for myalgias, joint pain and falls.  Neurological: Negative for dizziness, tingling, tremors, sensory change, speech change, focal weakness, seizures, loss of consciousness, weakness and headaches.  Endo/Heme/Allergies: Negative for polydipsia. Does not bruise/bleed easily.  Psychiatric/Behavioral: Negative for depression, suicidal ideas, hallucinations, memory loss and substance abuse. The patient is not nervous/anxious and does not have insomnia.     Blood pressure 136/66, pulse 87, temperature 98 F (36.7 C), temperature source Oral, resp. rate 16, height 6\' 5"  (1.956 m), weight 92.987 kg (205 lb).Body mass index is 24.31 kg/(m^2).  General Appearance: Fairly Groomed  Patent attorney::  Good  Speech:  Clear and Coherent  Volume:  Normal  Mood:  Anxious and Depressed  Affect:  Congruent  Thought Process:  Circumstantial  Orientation:  Full (Time, Place, and Person)  Thought Content:  Delusions  Suicidal Thoughts:  No  Homicidal Thoughts:  No  Memory:  Immediate;   Poor   Judgement:  Impaired  Insight:  Lacking  Psychomotor Activity:  Normal  Concentration:  Fair  Recall:  Poor  Akathisia:  No  Handed:    AIMS (if indicated):     Assets:  Desire for Improvement Social Support  Sleep:  Number of Hours: 6.75    Current Medications: Current Facility-Administered Medications  Medication Dose Route Frequency Provider Last Rate Last Dose  . acetaminophen (TYLENOL) tablet 650 mg  650 mg Oral Q6H PRN Wonda Cerise, MD   650 mg at 04/20/12 1344  . alum & mag hydroxide-simeth (MAALOX/MYLANTA) 200-200-20 MG/5ML suspension 30 mL  30 mL Oral Q4H PRN Wonda Cerise, MD      . ARIPiprazole (ABILIFY) tablet 5 mg  5 mg Oral Daily Wonda Cerise, MD   5 mg at 04/20/12 0843  . magnesium hydroxide (MILK OF MAGNESIA) suspension 30 mL  30 mL Oral Daily PRN Wonda Cerise, MD      . nicotine polacrilex (NICORETTE) gum 2 mg  2 mg Oral PRN Verne Spurr, PA-C   2 mg at 04/17/12 1543  . traZODone (DESYREL) tablet 150 mg  150 mg Oral QHS Nanine Means, NP   150 mg at 04/19/12 2128    Lab Results: No results found for this or any previous visit (from the past 48 hour(s)).  Physical Findings: AIMS: Facial and Oral Movements Muscles of Facial Expression: None, normal Lips and Perioral Area: None, normal Jaw: None, normal Tongue: None, normal,Extremity Movements Upper (arms, wrists, hands, fingers): None, normal Lower (legs, knees, ankles, toes): None, normal, Trunk Movements Neck, shoulders, hips: None, normal, Overall Severity Severity of abnormal movements (highest score from questions  above): None, normal Incapacitation due to abnormal movements: None, normal Patient's awareness of abnormal movements (rate only patient's report): No Awareness, Dental Status Current problems with teeth and/or dentures?: No Does patient usually wear dentures?: No  CIWA:    COWS:     Treatment Plan Summary: Daily contact with patient to assess and evaluate symptoms and progress in  treatment Medication management  Plan: 1. Nathan Poole did give written consent for me to speak with his mother [name] at [phone].  No answer at either number. 2. He has no insurance and states the abilify is too expensive, but his mother has been paying for it. 3. Hope to discuss previous medication management with Mother, and possible source of housing upon discharge if possible. 4. Will continue current plan of care until further collateral can be gathered. 5. Patient has requested to program on the [address] [place] and an order is written. 6. Will D/C Abilify and start Risperdal.   Medical Decision Making Problem Points:  Established problem, stable/improving (1) and Review of psycho-social stressors (1) Data Points:  Review of medication regiment & side effects (2)  I certify that inpatient services furnished can reasonably be expected to improve the patient's condition.    Rona Ravens. Kashaun Bebo PAC 04/20/2012, 3:01 PM

## 2012-04-20 NOTE — Progress Notes (Signed)
Pt denies SI/HI/AVH. Pt denies pain and show no s/s of distress. Pt attends group and participate. Pt engaged with others on the unit. Pt appears depressed and continues to worry about placement after discharge. Pt reports not having family support. Pt often isolates himself throughout the day, sitting alone in his room or hallway with depressed not wanting to talk to anyone.  Medications administered as ordered per MD. Verbal support given. Pt encouraged to attend groups. 15 minute checks performed for safety.   Pt compliant with treatment and medications. Depressed affect.

## 2012-04-21 NOTE — Progress Notes (Signed)
[facility] Group Notes:  (Counselor/Nursing/MHT/Case Management/Adjunct)  04/21/2012 9:43 AM  Type of Therapy:  Discharge Planning  Participation Level:  Active  Participation Quality:  Appropriate  Affect:  Appropriate  Cognitive:  Appropriate  Insight:  Engaged  Engagement in Group:  Engaged  Engagement in Therapy:  Engaged  Modes of Intervention:  Clarification, Armed forces technical officer of Progress/Problems: Jonathon shared that he had no identification with either suicidal or homicidal ideation.  He reated his depression at a 0 and his anxiety at a 6 both on a scale of 1-10 with 10 being the highest. Jonathon expressed gratitude that he will be programming on 500 hall today.     Clide Dales 04/21/2012, 9:43 AM

## 2012-04-21 NOTE — Progress Notes (Signed)
Patient ID: Nathan Poole, male   DOB: 25-Dec-1990, 21 y.o.   MRN: [id number] [facility] MD Progress Note  04/21/2012 10:50 AM Nathan Poole  MRN:  [id number] Subjective:  "I am doing better, I want to move to [place] when I am discharged". Patient reports that he is sleeping better and feeling less depressed.  Diagnosis:  Major depressive disorder, recurrent                      BPD  ADL's:  Intact  Sleep: Good  Appetite:  Good  Suicidal Ideation:  denies Homicidal Ideation:  denies AEB (as evidenced by): Patient's verbal response, affect and written reports on daily self inventory.  Psychiatric Specialty Exam: Review of Systems  Constitutional: Negative.  Negative for fever, chills, weight loss, malaise/fatigue and diaphoresis.  HENT: Negative for congestion and sore throat.   Eyes: Negative for blurred vision, double vision and photophobia.  Respiratory: Negative for cough, shortness of breath and wheezing.   Cardiovascular: Negative for chest pain, palpitations and PND.  Gastrointestinal: Negative for heartburn, nausea, vomiting, abdominal pain, diarrhea and constipation.  Musculoskeletal: Negative for myalgias, joint pain and falls.  Neurological: Negative for dizziness, tingling, tremors, sensory change, speech change, focal weakness, seizures, loss of consciousness, weakness and headaches.  Endo/Heme/Allergies: Negative for polydipsia. Does not bruise/bleed easily.  Psychiatric/Behavioral: Positive for depression. Negative for suicidal ideas, hallucinations, memory loss and substance abuse. The patient is not nervous/anxious and does not have insomnia.     Blood pressure 121/69, pulse 102, temperature 97.5 F (36.4 C), temperature source Oral, resp. rate 16, height 6\' 5"  (1.956 m), weight 92.987 kg (205 lb).Body mass index is 24.31 kg/(m^2).  General Appearance: Fairly Groomed  Patent attorney::  Good  Speech:  Clear and Coherent  Volume:  Normal  Mood:  Anxious and  Depressed  Affect:  Congruent  Thought Process:  Circumstantial  Orientation:  Full (Time, Place, and Person)  Thought Content:  Delusions  Suicidal Thoughts:  No  Homicidal Thoughts:  No  Memory:  Immediate;   Poor  Judgement:  Impaired  Insight:  Lacking  Psychomotor Activity:  Normal  Concentration:  Fair  Recall:  Poor  Akathisia:  No  Handed:    AIMS (if indicated):     Assets:  Desire for Improvement Social Support  Sleep:  Number of Hours: 6.25    Current Medications: Current Facility-Administered Medications  Medication Dose Route Frequency Provider Last Rate Last Dose  . acetaminophen (TYLENOL) tablet 650 mg  650 mg Oral Q6H PRN Wonda Cerise, MD   650 mg at 04/20/12 2006  . alum & mag hydroxide-simeth (MAALOX/MYLANTA) 200-200-20 MG/5ML suspension 30 mL  30 mL Oral Q4H PRN Wonda Cerise, MD      . magnesium hydroxide (MILK OF MAGNESIA) suspension 30 mL  30 mL Oral Daily PRN Wonda Cerise, MD      . nicotine polacrilex (NICORETTE) gum 2 mg  2 mg Oral PRN Verne Spurr, PA-C   2 mg at 04/21/12 0640  . risperiDONE (RISPERDAL M-TABS) disintegrating tablet 1 mg  1 mg Oral QHS Verne Spurr, PA-C   1 mg at 04/20/12 2150  . traZODone (DESYREL) tablet 150 mg  150 mg Oral QHS Nanine Means, NP   150 mg at 04/20/12 2150    Lab Results: No results found for this or any previous visit (from the past 48 hour(s)).  Physical Findings: AIMS: Facial and Oral Movements Muscles of Facial Expression: None, normal Lips and Perioral  Area: None, normal Jaw: None, normal Tongue: None, normal,Extremity Movements Upper (arms, wrists, hands, fingers): None, normal Lower (legs, knees, ankles, toes): None, normal, Trunk Movements Neck, shoulders, hips: None, normal, Overall Severity Severity of abnormal movements (highest score from questions above): None, normal Incapacitation due to abnormal movements: None, normal Patient's awareness of abnormal movements (rate only patient's report): No Awareness,  Dental Status Current problems with teeth and/or dentures?: No Does patient usually wear dentures?: No  CIWA:    COWS:     Treatment Plan Summary: Daily contact with patient to assess and evaluate symptoms and progress in treatment Medication management  Plan: 1. Tadeo did give written consent for me to speak with his mother [name] at [phone].  No answer at either number. 2. He has no insurance and states the abilify is too expensive, but his mother has been paying for it. 3. Hope to discuss previous medication management with Mother, and possible source of housing upon discharge if possible. 4. Will continue current plan of care until further collateral can be gathered. 5. Patient has requested to program on the [address] [place] and an order is written. 6. Will D/C Abilify and start Risperdal.   Medical Decision Making Problem Points:  Established problem, stable/improving (1) and Review of psycho-social stressors (1) Data Points:  Review of medication regiment & side effects (2)  I certify that inpatient services furnished can reasonably be expected to improve the patient's condition.    Thedore Mins, MD 04/21/2012, 10:50 AM

## 2012-04-21 NOTE — Progress Notes (Signed)
Psychoeducational Group Note  Date:  04/21/2012 Time:  1100    Group Topic/Focus:  Healthy Communication:   The focus of this group is to discuss communication, barriers to communication, as well as healthy ways to communicate with others.  Participation Level:  Active  Participation Quality:  Appropriate and Attentive  Affect:  Appropriate  Cognitive:  Alert and Appropriate  Insight:  Supportive  Engagement in Group:  Supportive  Additional Comments:    Noah Charon 04/21/2012, 11:39 AM

## 2012-04-21 NOTE — Progress Notes (Signed)
Patient ID: Nathan Poole, male   DOB: [birth year], 21 y.o.   MRN: [id number]  Patient has been pleasant and cooperative today.  He has been programming the [facility]; he states that he was not learning anything from the groups on the [facility] due to disruptions.  He was interacting well with others on the 500 hall; he has not taken any medications this morning.  He denies any SI/HI/AVH today.  He denies any depression.  Continue to monitor medication management and MD orders.  Collaborate with treatment team regarding patient's POC.  Safety checks completed every 15 minutes per protocol.  Patient's behavior has been appropriate.  Encourage and support patient as needed.

## 2012-04-21 NOTE — Progress Notes (Signed)
Psychoeducational Group Note  Date:  04/21/2012 Time:  2000  Group Topic/Focus:  karaoke  Participation Level:  Active  Participation Quality:  Appropriate  Affect:  Appropriate  Cognitive:  Alert  Insight:  Engaged  Engagement in Group:  Engaged  Additional Comments:    Yvanna Vidas R 04/21/2012, 11:26 PM

## 2012-04-21 NOTE — Progress Notes (Signed)
Patient ID: Nathan Poole, male   DOB: 1990/10/09, 21 y.o.   MRN: [id number] D: pt. Visible on the unit, no complaints at this time. Pt. Reports ready for discharge. "I'll be staying at a shelter for a few days, then my friend going to send for me." "My friend is going to send me and ten of my friends on a cruise."  A: Staff will monitor for safety q68min. Writer encouraged group. R: Pt. Is safe on the unit. Pt. Attended karaoke, sang and danced.

## 2012-04-22 MED ORDER — RISPERIDONE 1 MG PO TBDP: 1.0000 mg | ORAL_TABLET | Freq: Every day | ORAL | Status: DC

## 2012-04-22 MED ORDER — TRAZODONE HCL 150 MG PO TABS: 150.0000 mg | ORAL_TABLET | Freq: Every day | ORAL | Status: DC

## 2012-04-22 NOTE — [facility] Suicide Risk Assessment (Signed)
Suicide Risk Assessment  Discharge Assessment     Demographic Factors:  Male  Mental Status Per Nursing Assessment::   On Admission:  Suicidal ideation indicated by others;Self-harm behaviors;Self-harm thoughts  Current Mental Status by Physician: patient denies suicidal ideations, intent or plan.  Loss Factors: Decrease in vocational status and Financial problems/change in socioeconomic status  Historical Factors: previous history of suicide attempt denied by the patient  Risk Reduction Factors:   Positive therapeutic relationship and Positive coping skills or problem solving skills  Continued Clinical Symptoms:  Resolving depressive symptoms  Cognitive Features That Contribute To Risk:  Closed-mindedness    Suicide Risk:  Minimal: No identifiable suicidal ideation.  Patients presenting with no risk factors but with morbid ruminations; may be classified as minimal risk based on the severity of the depressive symptoms  Discharge Diagnoses:   AXIS I:  Major depression, recurrent AXIS II:  Deferred AXIS III:   Past Medical History  Diagnosis Date  . Asthma    AXIS IV:  housing problems, other psychosocial or environmental problems and problems with primary support group AXIS V:  61-70 mild symptoms  Plan Of Care/Follow-up recommendations:  Activity:  as tolerated Diet:  healthy Tests:  routine Other:  patient to keep his after care appointment and take his medicatios as reccommended.  Is patient on multiple antipsychotic therapies at discharge:  No   Has Patient had three or more failed trials of antipsychotic monotherapy by history:  No  Recommended Plan for Multiple Antipsychotic Therapies: N/A  Adali Pennings,MD 04/22/2012, 10:49 AM

## 2012-04-22 NOTE — Discharge Summary (Signed)
Physician Discharge Summary Note  Patient:  Nathan Poole is an 21 y.o., male MRN:  [id number] DOB:  05-26-1990 Patient phone:  [phone] (home)  Patient address:   [address] [place] [address],   Date of Admission:  04/16/2012 Date of Discharge: 04/22/2012  Reason for Admission:  psychosis Review of Systems  Constitutional: Negative.  Negative for fever, chills, weight loss, malaise/fatigue and diaphoresis.  HENT: Negative for congestion and sore throat.   Eyes: Negative for blurred vision, double vision and photophobia.  Respiratory: Negative for cough, shortness of breath and wheezing.   Cardiovascular: Negative for chest pain, palpitations and PND.  Gastrointestinal: Negative for heartburn, nausea, vomiting, abdominal pain, diarrhea and constipation.  Musculoskeletal: Negative for myalgias, joint pain and falls.  Neurological: Negative for dizziness, tingling, tremors, sensory change, speech change, focal weakness, seizures, loss of consciousness, weakness and headaches.  Endo/Heme/Allergies: Negative for polydipsia. Does not bruise/bleed easily.  Psychiatric/Behavioral: Negative for depression, suicidal ideas, hallucinations, memory loss and substance abuse. The patient is not nervous/anxious and does not have insomnia.    Axis Diagnosis:  Discharge Diagnoses:  AXIS I: Major depression, recurrent  AXIS II: Deferred  AXIS III:  Past Medical History   Diagnosis  Date   .  Asthma    AXIS IV: housing problems, other psychosocial or environmental problems and problems with primary support group  AXIS V: 61-70 mild symptoms   Level of Care:  OP  Hospital Course:  [place] was admitted for in patient treatment, stabilization and medication management after he reported to the ED where he complained of feeling suicidal with plans to cut his wrists after an argument with his mother, who then kicked him out of the house.    He was treated for major depression with  the symptoms of hopelessness, helplessness, sadness, and suicidal ideation by restarting him on Risperdal M tabs 1 mg, trazodone for sleep and proventil for his wheezing.    Wheeler was reluctant to speak with his mother regarding her assistance, and also resistant to take medication. 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  He participated minimally in groups, refused medication, but was cooperative for the most of his admission.  He no longer met criteria for forced in patient stay as he denied SI/HI, AVH and requested discharge.  Curvin was in full contact with reality, did not want to continue his medication and refused to consider going to [facility] for follow up.  Consults:  None  Significant Diagnostic Studies:  None  Discharge Vitals:   Blood pressure 115/70, pulse 96, temperature 98 F (36.7 C), temperature source Oral, resp. rate 16, height 6\' 5"  (1.956 m), weight 92.987 kg (205 lb). Body mass index is 24.31 kg/(m^2). Lab Results:   No results found for this or any previous visit (from the past 72 hour(s)).  Physical Findings: AIMS: Facial and Oral Movements Muscles of Facial Expression: None, normal Lips and Perioral Area: None, normal Jaw: None, normal Tongue: None, normal,Extremity Movements Upper (arms, wrists, hands, fingers): None, normal Lower (legs, knees, ankles, toes): None, normal, Trunk Movements Neck, shoulders, hips: None, normal, Overall Severity Severity of abnormal movements (highest score from questions above): None, normal Incapacitation due to abnormal movements: None, normal Patient's awareness of abnormal movements (rate only patient's report): No Awareness, Dental Status Current problems with teeth and/or dentures?: No Does patient usually wear dentures?: No  CIWA:    COWS:     Psychiatric Specialty  Exam: See Psychiatric Specialty Exam and Suicide Risk Assessment completed by Attending  Physician prior to discharge.  Discharge destination:  Home  Is patient on multiple antipsychotic therapies at discharge:  No   Has Patient had three or more failed trials of antipsychotic monotherapy by history:  No  Recommended Plan for Multiple Antipsychotic Therapies: NA  Discharge Orders    Future Orders Please Complete By Expires   Diet - low sodium heart healthy      Diet - low sodium heart healthy      Increase activity slowly      Discharge instructions      Comments:   Take all of your medications as prescribed.  Be sure to keep ALL follow up appointments as scheduled. This is to ensure getting your refills on time to avoid any interruption in your medication.  If you find that you can not keep your appointment, call and reschedule. Be sure to tell the nurse if you will need a refill before your appointment.   Increase activity slowly      Discharge instructions      Comments:   Take all of your medications as prescribed.  Be sure to keep ALL follow up appointments as scheduled. This is to ensure getting your refills on time to avoid any interruption in your medication.  If you find that you can not keep your appointment, call and reschedule. Be sure to tell the nurse if you will need a refill before your appointment.       Medication List     As of 04/22/2012 10:03 AM    STOP taking these medications         ARIPiprazole 5 MG tablet   Commonly known as: ABILIFY      divalproex 500 MG 24 hr tablet   Commonly known as: DEPAKOTE ER      QUEtiapine 50 MG tablet   Commonly known as: SEROQUEL      risperiDONE 0.5 MG tablet   Commonly known as: RISPERDAL      TAKE these medications      Indication    albuterol 108 (90 BASE) MCG/ACT inhaler   Commonly known as: PROVENTIL HFA;VENTOLIN HFA   Inhale 2 puffs into the lungs 2 (two) times daily as needed for wheezing or shortness of breath. For wheezing       risperiDONE 1 MG disintegrating tablet   Commonly known as:  RISPERDAL M-TABS   Take 1 tablet (1 mg total) by mouth at bedtime. For psychosis and sleep.    Indication: psychosis      traZODone 150 MG tablet   Commonly known as: DESYREL   Take 1 tablet (150 mg total) by mouth at bedtime. For insomnia.    Indication: Trouble Sleeping         Follow-up recommendations:  refused  Comments:   Total Discharge Time:  >30 minutes  Signed: Lloyd Huger T. Weston Fulco PAC 04/22/2012, 10:03 AM

## 2012-04-22 NOTE — Progress Notes (Signed)
Patient denies SI/HI, denies A/V hallucinations. Patient verbalizes understanding of discharge instructions, follow up care and prescriptions. Patient given all belongings from [facility] locker. Patient escorted out by staff, transported by public transportation.  

## 2012-04-22 NOTE — Progress Notes (Signed)
[facility] INPATIENT:  Family/Significant Other Suicide Prevention Education  Suicide Prevention Education:  Patient Refusal for Family/Significant Other Suicide Prevention Education: The patient Nathan Poole has refused to provide written consent for family/significant other to be provided Family/Significant Other Suicide Prevention Education during admission and/or prior to discharge.  Physician notified.  [name] is not returning to live with his mother.  He is currently angry with her, and wants nothing to do with her.  He refused to give me permission to talk to her.  He states he has no other supports.  Went over suicide prevention information with him, and gave him a brochure.  [name] B 04/22/2012, 10:18 AM

## 2012-04-22 NOTE — Progress Notes (Signed)
[facility] Adult Case Management Discharge Plan :  Will you be returning to the same living situation after discharge: No. At discharge, do you have transportation home?:Yes,  bus pass Do you have the ability to pay for your medications:Yes,  mental health  Release of information consent forms completed and in the chart;  Patient's signature needed at discharge.  Patient to Follow up at:   Patient denies SI/HI:   Yes,  yes    Safety Planning and Suicide Prevention discussed:  Yes,  yes  [name] B 04/22/2012, 10:10 AM

## 2012-04-22 NOTE — Progress Notes (Signed)
[facility] Group Notes:  (Counselor/Nursing/MHT/Case Management/Adjunct)  04/22/2012 9:18 AM  Type of Therapy:  Discharge Planning  Participation Level:  Active  Participation Quality:  Appropriate  Affect:  Appropriate  Cognitive:  Appropriate  Insight:  Engaged  Engagement in Group:  Engaged  Engagement in Therapy:  Engaged  Modes of Intervention:  Clarification, Discussion and Exploration  Summary of Progress/Problems:  Patient helped another patient by sharing how he makes contact with a deceased friend who was cremated thus has no gravesite. Other patient was appreciative of suggestion.  Patient shared how he is looking forward to discharge today, appreciative of help received here.  Patient is planning to [facility] into [facility] before returning to [place] before the end of 2013.    Clide Dales 04/22/2012, 9:18 AM

## 2012-04-26 NOTE — Progress Notes (Signed)
Patient Discharge Instructions:  After Visit Summary (AVS):   Faxed to:  04/26/12 Psychiatric Admission Assessment Note:   Faxed to:  04/26/12 Suicide Risk Assessment - Discharge Assessment:   Faxed to:  04/26/12 Faxed/Sent to the Next Level Care provider:  04/26/12 Faxed to [facility] @ [phone]  Jerelene Redden, 04/26/2012, 3:19 PM

## 2012-05-10 NOTE — Discharge Summary (Signed)
Seen and agreed. Belkis Norbeck, MD 

## 2012-05-26 ENCOUNTER — Emergency Department (HOSPITAL_COMMUNITY)
Admission: EM | Admit: 2012-05-26 | Discharge: 2012-05-26 | Disposition: A | Discharge status: Home / Self Care | Payer: Self-pay | Attending: Emergency Medicine | Admitting: Emergency Medicine

## 2012-05-26 ENCOUNTER — Encounter (HOSPITAL_COMMUNITY): Payer: Self-pay | Admitting: Radiology

## 2012-05-26 ENCOUNTER — Encounter (HOSPITAL_COMMUNITY): Payer: Self-pay | Admitting: *Deleted

## 2012-05-26 ENCOUNTER — Emergency Department (HOSPITAL_COMMUNITY)
Admission: EM | Admit: 2012-05-26 | Discharge: 2012-05-27 | Disposition: A | Discharge status: Home / Self Care | Payer: Self-pay | Attending: Emergency Medicine | Admitting: Emergency Medicine

## 2012-05-26 ENCOUNTER — Emergency Department (HOSPITAL_COMMUNITY): Payer: Self-pay

## 2012-05-26 DIAGNOSIS — M549 Dorsalgia, unspecified: Secondary | ICD-10-CM

## 2012-05-26 DIAGNOSIS — R42 Dizziness and giddiness: Secondary | ICD-10-CM | POA: Insufficient documentation

## 2012-05-26 DIAGNOSIS — F319 Bipolar disorder, unspecified: Secondary | ICD-10-CM | POA: Insufficient documentation

## 2012-05-26 DIAGNOSIS — R109 Unspecified abdominal pain: Secondary | ICD-10-CM | POA: Insufficient documentation

## 2012-05-26 DIAGNOSIS — R319 Hematuria, unspecified: Secondary | ICD-10-CM | POA: Insufficient documentation

## 2012-05-26 DIAGNOSIS — F17200 Nicotine dependence, unspecified, uncomplicated: Secondary | ICD-10-CM | POA: Insufficient documentation

## 2012-05-26 DIAGNOSIS — Z87828 Personal history of other (healed) physical injury and trauma: Secondary | ICD-10-CM | POA: Insufficient documentation

## 2012-05-26 DIAGNOSIS — IMO0002 Reserved for concepts with insufficient information to code with codable children: Secondary | ICD-10-CM | POA: Insufficient documentation

## 2012-05-26 DIAGNOSIS — Y9301 Activity, walking, marching and hiking: Secondary | ICD-10-CM | POA: Insufficient documentation

## 2012-05-26 DIAGNOSIS — X500XXA Overexertion from strenuous movement or load, initial encounter: Secondary | ICD-10-CM | POA: Insufficient documentation

## 2012-05-26 DIAGNOSIS — Y929 Unspecified place or not applicable: Secondary | ICD-10-CM | POA: Insufficient documentation

## 2012-05-26 DIAGNOSIS — Z8659 Personal history of other mental and behavioral disorders: Secondary | ICD-10-CM | POA: Insufficient documentation

## 2012-05-26 DIAGNOSIS — F329 Major depressive disorder, single episode, unspecified: Secondary | ICD-10-CM | POA: Insufficient documentation

## 2012-05-26 DIAGNOSIS — J45909 Unspecified asthma, uncomplicated: Secondary | ICD-10-CM | POA: Insufficient documentation

## 2012-05-26 DIAGNOSIS — Z79899 Other long term (current) drug therapy: Secondary | ICD-10-CM | POA: Insufficient documentation

## 2012-05-26 DIAGNOSIS — F1210 Cannabis abuse, uncomplicated: Secondary | ICD-10-CM | POA: Insufficient documentation

## 2012-05-26 DIAGNOSIS — F3289 Other specified depressive episodes: Secondary | ICD-10-CM | POA: Insufficient documentation

## 2012-05-26 HISTORY — DX: Unspecified injury of lower back, initial encounter: S39.92XA

## 2012-05-26 LAB — URINALYSIS, ROUTINE W REFLEX MICROSCOPIC
Glucose, UA: NEGATIVE mg/dL
Leukocytes, UA: NEGATIVE
Protein, ur: NEGATIVE mg/dL
Specific Gravity, Urine: 1.026 (ref 1.005–1.030)

## 2012-05-26 LAB — BASIC METABOLIC PANEL
CO2: 24 mEq/L (ref 19–32)
Calcium: 9.9 mg/dL (ref 8.4–10.5)
GFR calc non Af Amer: >90 mL/min (ref >90)
Potassium: 3.8 mEq/L (ref 3.5–5.1)
Sodium: 138 mEq/L (ref 135–145)

## 2012-05-26 NOTE — ED Provider Notes (Addendum)
22 year old male with a history of 2 days of intermittent left-sided flank pain described as sharp and stabbing, radiating to the flank and the lower abdomen, spontaneous, nothing makes better or worse, associated with hematuria.  On exam the patient is a soft nontender abdomen, mild CVA tenderness to the left, in no acute distress.  Assessment: The patient has signs and symptoms consistent with a possible kidney stone, rule out renal uropathy, urinary infection, pain medication and a CT scan without contrast.   CT scan is negative, urinalysis reveals no hematuria, no infection, patient stable for discharge  Medical screening examination/treatment/procedure(s) were conducted as a shared visit with non-physician practitioner(s) and myself.  I personally evaluated the patient during the encounter    Vida Roller, MD 05/26/12 1610  Vida Roller, MD 05/26/12 0800

## 2012-05-26 NOTE — ED Notes (Addendum)
Pt states that his lower back "gave out and I got numb." Pt states his lower back is "locked." Pt states BLE are numb. No change in bowel/bladder habits.

## 2012-05-26 NOTE — ED Notes (Signed)
The patient states he was walking this morning at 0200 when he started feeling left-sided flank pain.  The patient denies trauma.

## 2012-05-26 NOTE — ED Notes (Signed)
Report received, assumed care.  

## 2012-05-26 NOTE — ED Provider Notes (Signed)
History     CSN: [id number]  Arrival date & time 05/26/12  0532   First MD Initiated Contact with Patient 05/26/12 0533      Chief Complaint  Patient presents with  . Flank Pain    (Consider location/radiation/quality/duration/timing/severity/associated sxs/prior treatment) Patient is a 22 y.o. male presenting with flank pain.  Flank Pain This is a new problem. The current episode started today. The problem occurs constantly. The problem has been gradually worsening. Associated symptoms include abdominal pain and urinary symptoms. Pertinent negatives include no change in bowel habit, chest pain, chills, diaphoresis, fever, nausea, vomiting or weakness. Associated symptoms comments: +hematuria. He has tried nothing for the symptoms.    Past Medical History  Diagnosis Date  . Asthma   . Bipolar 1 disorder   . Depression     Past Surgical History  Procedure Date  . Appendectomy     No family history on file.  History  Substance Use Topics  . Smoking status: Smoker, Current Status Unknown -- 2.0 packs/day for 5 years  . Smokeless tobacco: Current User  . Alcohol Use:       Review of Systems  Constitutional: Negative for fever, chills and diaphoresis.  Respiratory: Negative for shortness of breath.   Cardiovascular: Negative for chest pain.  Gastrointestinal: Positive for abdominal pain. Negative for nausea, vomiting and change in bowel habit.  Genitourinary: Positive for hematuria and flank pain. Negative for urgency and frequency.       Hematuria x 2 days  Neurological: Positive for light-headedness. Negative for weakness.       Lightheadedness associated with increased pain    Allergies  Morphine and related and Penicillins  Home Medications   Current Outpatient Rx  Name  Route  Sig  Dispense  Refill  . ALBUTEROL SULFATE HFA 108 (90 BASE) MCG/ACT IN AERS   Inhalation   Inhale 2 puffs into the lungs 2 (two) times daily as needed for wheezing or shortness of  breath. For wheezing   1 Inhaler   0   . RISPERIDONE 1 MG PO TBDP   Oral   Take 1 tablet (1 mg total) by mouth at bedtime. For psychosis and sleep.   30 tablet   0   . TRAZODONE HCL 150 MG PO TABS   Oral   Take 1 tablet (150 mg total) by mouth at bedtime. For insomnia.   15 tablet   0     BP 96/43  Pulse 55  Temp 99 F (37.2 C) (Oral)  Resp 16  Ht 6\' 5"  (1.956 m)  Wt 235 lb (106.595 kg)  BMI 27.87 kg/m2  SpO2 97%  Physical Exam  Constitutional: He is oriented to person, place, and time. He appears well-developed and well-nourished. No distress.  HENT:  Head: Normocephalic and atraumatic.  Eyes: No scleral icterus.  Cardiovascular: Normal rate, regular rhythm and normal heart sounds.   Pulmonary/Chest: Effort normal and breath sounds normal.  Abdominal: Soft. Bowel sounds are normal. He exhibits no distension and no mass. There is tenderness. There is no guarding.       Tender in LLQ and L flank  Neurological: He is alert and oriented to person, place, and time.  Skin: Skin is warm and dry.  Psychiatric: He has a normal mood and affect. His behavior is normal.    ED Course  Procedures (including critical care time)  Labs Reviewed  BASIC METABOLIC PANEL - Abnormal; Notable for the following:    Glucose, Bld  106 (*)     All other components within normal limits  URINALYSIS, ROUTINE W REFLEX MICROSCOPIC   Ct Abdomen Pelvis Wo Contrast  05/26/2012  *RADIOLOGY REPORT*  Clinical Data: Left flank pain and hematuria.  CT ABDOMEN AND PELVIS WITHOUT CONTRAST  Technique:  Multidetector CT imaging of the abdomen and pelvis was performed following the standard protocol without intravenous contrast.  Comparison: None.  Findings: The visualized lung bases are clear.  The liver and spleen are unremarkable in appearance.  The gallbladder is within normal limits.  The pancreas and adrenal glands are unremarkable.  Scattered tiny bilateral renal stones are seen, slightly more  prominent on the right.  The kidneys are otherwise unremarkable in appearance.  There is no evidence of hydronephrosis.  No obstructing ureteral stones are identified.  No significant perinephric stranding is seen.  The small bowel is unremarkable in appearance.  The stomach is within normal limits.  No acute vascular abnormalities are seen.  The patient is status post appendectomy.  The colon is partially filled with stool and is unremarkable in appearance.  The bladder is mildly distended; mild bladder wall thickening could reflect mild cystitis, or could remain within normal limits.  A urachal remnant is incidentally noted.  A small amount of free fluid is noted within the pelvis, of uncertain etiology.  The prostate remains normal in size.  No inguinal lymphadenopathy is seen.  No acute osseous abnormalities are identified.  IMPRESSION:  1.  Small amount of free fluid noted within the pelvis, of uncertain etiology. 2.  No evidence of hydronephrosis.  No obstructing ureteral stones seen. 3.  Scattered tiny bilateral renal stones seen, slightly more prominent on the right. 4.  Mild bladder wall thickening could reflect mild cystitis, given the patient's symptoms, or could remain within normal limits.   Original Report Authenticated By: Tonia Ghent, M.D.      1. Flank pain       MDM  Patient presents c/o abdominal pain starting on his left side and radiating to his LLQ and left flank since 2am. States the pain is gradually worsening and is currently an 8/10. He denies any aggravating or alleviating factors. Admits to hematuria x 2 days and denies urinary frequency, urgency, fever, chills, N/V/D, SOB, and chest pain.  Will work up patient for kidney stone including a CT abd/pelvis s contrast, Bmet, and UA. Patient states that he can't take any NSAIDs due to allergy, although is unable to explain what type of reaction would occur if he were to take them.  CT shows scattered tiny b/l renal stones and  mild bladder wall thickening reflecting ? mild cystitis. Believe he is stable for discharge; currently awaiting UA as patient still has not voided. Has been given fluids PO with the hope that he will soon void.  Have discussed the patient with Dr. Hyacinth Meeker who will take over full care of the patient from this point on.   Antony Madura, Cordelia Poche 05/26/12 5621

## 2012-05-26 NOTE — ED Provider Notes (Signed)
Medical screening examination/treatment/procedure(s) were performed by non-physician practitioner and as supervising physician I was immediately available for consultation/collaboration.   Richardean Canal, MD 05/26/12 [phone]

## 2012-05-26 NOTE — ED Provider Notes (Signed)
MSE was initiated and I personally evaluated the patient and placed orders (if any) at  10:44 PM on May 26, 2012.  22 year old male with back pain. The patient initially evaluated, and states that he has had worsening back pain today. He states that now he is unable to move his legs at all. He denies any sensation in his legs. Patient states that he was walking earlier, and his lower body became completely and that he has been unable to move his legs since. Patient has past medical history remarkable for borderline and depression. He was seen earlier today for flank pain.  O: PE: Gen: A&O x4 HEENT: PERRL, EOM intact CHEST: RRR, no m/r/g LUNGS: CTAB, no w/r/r ABD:  ND/NT EXT/neuro: Patient unable to move bilateral lower extremities, also no response to sensation or painful stimulus of the lower extremities.     A/P: There is likely some psychiatric component at play here, I believe that the patient is possibly malingering, however do to subjective and objective findings, I believe that more extensive work up will be required including possible MRI. Discussed the patient with Dr. Silverio Lay, who states that it will be appropriate to move the patient to an acute pod.  The patient appears stable so that the remainder of the MSE may be completed by another provider.  Roxy Horseman, PA-C 05/26/12 2249

## 2012-05-26 NOTE — ED Notes (Addendum)
Pt has hx of "back slipping out" caused by football injury.  Last time his "back went out" he had to lay flat for 3 weeks until his s/s improved.  Tonight pt was walking and his lower body became completely numb.  Pt was unable to move, but did not fall.  Pt was able to move from stretcher to wheelchair with assistance. Pt states pain with movement and numbness while sitting still.

## 2012-05-26 NOTE — ED Provider Notes (Signed)
  Medical screening examination/treatment/procedure(s) were conducted as a shared visit with non-physician practitioner(s) and myself.  I personally evaluated the patient during the encounter  Please see my separate respective documentation pertaining to this patient encounter   Vida Roller, MD 05/26/12 [phone]

## 2012-05-27 ENCOUNTER — Encounter (HOSPITAL_COMMUNITY): Payer: Self-pay | Admitting: Emergency Medicine

## 2012-05-27 ENCOUNTER — Emergency Department (HOSPITAL_COMMUNITY)
Admission: EM | Admit: 2012-05-27 | Discharge: 2012-05-28 | Disposition: A | Discharge status: Home / Self Care | Payer: Self-pay | Attending: Emergency Medicine | Admitting: Emergency Medicine

## 2012-05-27 ENCOUNTER — Emergency Department (HOSPITAL_COMMUNITY): Payer: Self-pay

## 2012-05-27 DIAGNOSIS — R45851 Suicidal ideations: Secondary | ICD-10-CM | POA: Insufficient documentation

## 2012-05-27 DIAGNOSIS — Z79899 Other long term (current) drug therapy: Secondary | ICD-10-CM | POA: Insufficient documentation

## 2012-05-27 DIAGNOSIS — F319 Bipolar disorder, unspecified: Secondary | ICD-10-CM | POA: Insufficient documentation

## 2012-05-27 DIAGNOSIS — J45909 Unspecified asthma, uncomplicated: Secondary | ICD-10-CM | POA: Insufficient documentation

## 2012-05-27 DIAGNOSIS — Z91199 Patient's noncompliance with other medical treatment and regimen due to unspecified reason: Secondary | ICD-10-CM | POA: Insufficient documentation

## 2012-05-27 DIAGNOSIS — F17200 Nicotine dependence, unspecified, uncomplicated: Secondary | ICD-10-CM | POA: Insufficient documentation

## 2012-05-27 DIAGNOSIS — Z9119 Patient's noncompliance with other medical treatment and regimen: Secondary | ICD-10-CM | POA: Insufficient documentation

## 2012-05-27 LAB — COMPREHENSIVE METABOLIC PANEL
ALT: 31 U/L (ref 0–53)
AST: 26 U/L (ref 0–37)
Albumin: 4.4 g/dL (ref 3.5–5.2)
Alkaline Phosphatase: 88 U/L (ref 39–117)
Chloride: 99 mEq/L (ref 96–112)
Potassium: 3.7 mEq/L (ref 3.5–5.1)
Sodium: 132 mEq/L — ABNORMAL LOW (ref 135–145)
Total Bilirubin: 0.8 mg/dL (ref 0.3–1.2)

## 2012-05-27 LAB — CBC
HCT: 38.7 % — ABNORMAL LOW (ref 39.0–52.0)
Platelets: 208 10*3/uL (ref 150–400)
RDW: 13.8 % (ref 11.5–15.5)
WBC: 7.9 10*3/uL (ref 4.0–10.5)

## 2012-05-27 LAB — RAPID URINE DRUG SCREEN, HOSP PERFORMED
Amphetamines: NOT DETECTED
Barbiturates: NOT DETECTED
Benzodiazepines: NOT DETECTED

## 2012-05-27 LAB — ACETAMINOPHEN LEVEL: Acetaminophen (Tylenol), Serum: <15 ug/mL (ref 10–30)

## 2012-05-27 MED ORDER — NAPROXEN 500 MG PO TABS: 500.0000 mg | ORAL_TABLET | Freq: Two times a day (BID) | ORAL | Status: DC

## 2012-05-27 MED ORDER — DIAZEPAM 5 MG PO TABS
5.0000 mg | ORAL_TABLET | Freq: Once | ORAL | Status: AC
  Administered 2012-05-27: 5 mg via ORAL
  Filled 2012-05-27: qty 1

## 2012-05-27 MED ORDER — DIAZEPAM 5 MG PO TABS: 5.0000 mg | ORAL_TABLET | Freq: Three times a day (TID) | ORAL | Status: DC | PRN

## 2012-05-27 MED ORDER — TRAZODONE HCL 50 MG PO TABS
150.0000 mg | ORAL_TABLET | Freq: Every day | ORAL | Status: DC
  Administered 2012-05-27: 150 mg via ORAL
  Filled 2012-05-27: qty 1

## 2012-05-27 MED ORDER — ARIPIPRAZOLE 5 MG PO TABS
5.0000 mg | ORAL_TABLET | Freq: Two times a day (BID) | ORAL | Status: DC
  Administered 2012-05-27: 5 mg via ORAL
  Filled 2012-05-27 (×3): qty 1

## 2012-05-27 MED ORDER — QUETIAPINE FUMARATE 300 MG PO TABS
600.0000 mg | ORAL_TABLET | Freq: Three times a day (TID) | ORAL | Status: DC
  Administered 2012-05-27: 600 mg via ORAL
  Filled 2012-05-27: qty 2

## 2012-05-27 MED ORDER — KETOROLAC TROMETHAMINE 30 MG/ML IJ SOLN
60.0000 mg | Freq: Once | INTRAMUSCULAR | Status: AC
  Administered 2012-05-27: 60 mg via INTRAMUSCULAR
  Filled 2012-05-27: qty 2

## 2012-05-27 MED ORDER — LORAZEPAM 1 MG PO TABS: 1.0000 mg | ORAL_TABLET | Freq: Three times a day (TID) | ORAL | Status: DC | PRN

## 2012-05-27 MED ORDER — ONDANSETRON HCL 4 MG PO TABS: 4.0000 mg | ORAL_TABLET | Freq: Three times a day (TID) | ORAL | Status: DC | PRN

## 2012-05-27 MED ORDER — NICOTINE 21 MG/24HR TD PT24: 21.0000 mg | MEDICATED_PATCH | Freq: Every day | TRANSDERMAL | Status: DC

## 2012-05-27 MED ORDER — ALBUTEROL SULFATE HFA 108 (90 BASE) MCG/ACT IN AERS: 2.0000 | INHALATION_SPRAY | Freq: Two times a day (BID) | RESPIRATORY_TRACT | Status: DC | PRN

## 2012-05-27 MED ORDER — ALUM & MAG HYDROXIDE-SIMETH 200-200-20 MG/5ML PO SUSP: 30.0000 mL | ORAL | Status: DC | PRN

## 2012-05-27 MED ORDER — ACETAMINOPHEN 325 MG PO TABS: 650.0000 mg | ORAL_TABLET | ORAL | Status: DC | PRN

## 2012-05-27 NOTE — ED Notes (Signed)
Pt present to TCU room 27 with c/o SI.  Pt denies HI.  Pt denies a plan.

## 2012-05-27 NOTE — ED Notes (Signed)
Pt discharged.Vital signs stable and GCS 15 

## 2012-05-27 NOTE — ED Provider Notes (Signed)
History     CSN: [id number]  Arrival date & time 05/27/12  2109   First MD Initiated Contact with Patient 05/27/12 2152      Chief Complaint  Patient presents with  . Suicidal    (Consider location/radiation/quality/duration/timing/severity/associated sxs/prior treatment) HPI History provided by pt.   Pt presents w/ SI.  He reports that he does not truly want to kill himself, but is having thoughts.  Has acted on these thoughts in the past by cutting himself.  Did not cut himself today.  Denies HI.  Denies drug/alcohol abuse.   H/o bipolar disorder and is non-compliant w/ medications d/t cost.    Past Medical History  Diagnosis Date  . Asthma   . Bipolar 1 disorder   . Depression   . Lower back injury     Past Surgical History  Procedure Date  . Appendectomy   . Bowel obstruction     No family history on file.  History  Substance Use Topics  . Smoking status: Smoker, Current Status Unknown -- 2.0 packs/day for 5 years    Types: Cigarettes  . Smokeless tobacco: Current User  . Alcohol Use: No      Review of Systems  All other systems reviewed and are negative.    Allergies  Morphine and related and Penicillins  Home Medications   Current Outpatient Rx  Name  Route  Sig  Dispense  Refill  . ALBUTEROL SULFATE HFA 108 (90 BASE) MCG/ACT IN AERS   Inhalation   Inhale 2 puffs into the lungs 2 (two) times daily as needed for wheezing or shortness of breath. For wheezing   1 Inhaler   0   . ARIPIPRAZOLE 5 MG PO TABS   Oral   Take 5 mg by mouth 2 (two) times daily.         Marland Kitchen NAPROXEN 500 MG PO TABS   Oral   Take 1 tablet (500 mg total) by mouth 2 (two) times daily.   30 tablet   0   . QUETIAPINE FUMARATE 300 MG PO TABS   Oral   Take 600 mg by mouth 3 (three) times daily.         . TRAZODONE HCL 150 MG PO TABS   Oral   Take 150 mg by mouth at bedtime.         Marland Kitchen DIAZEPAM 5 MG PO TABS   Oral   Take 5 mg by mouth every 8 (eight) hours as  needed.           BP 110/58  Pulse 70  Temp 98.3 F (36.8 C) (Oral)  Resp 20  Ht 6\' 5"  (1.956 m)  Wt 235 lb (106.595 kg)  BMI 27.87 kg/m2  SpO2 99%  Physical Exam  Nursing note and vitals reviewed. Constitutional: He is oriented to person, place, and time. He appears well-developed and well-nourished. No distress.  HENT:  Head: Normocephalic and atraumatic.  Eyes:       Normal appearance  Neck: Normal range of motion.  Pulmonary/Chest: Effort normal.  Musculoskeletal: Normal range of motion.  Neurological: He is alert and oriented to person, place, and time.  Skin: Skin is warm and dry. No rash noted.  Psychiatric:       Depressed mood and mildly agitated    ED Course  Procedures (including critical care time)  Labs Reviewed  URINE RAPID DRUG SCREEN (HOSP PERFORMED) - Abnormal; Notable for the following:    Tetrahydrocannabinol POSITIVE (*)  All other components within normal limits  ACETAMINOPHEN LEVEL  CBC  COMPREHENSIVE METABOLIC PANEL  ETHANOL  SALICYLATE LEVEL   Ct Abdomen Pelvis Wo Contrast  05/26/2012  *RADIOLOGY REPORT*  Clinical Data: Left flank pain and hematuria.  CT ABDOMEN AND PELVIS WITHOUT CONTRAST  Technique:  Multidetector CT imaging of the abdomen and pelvis was performed following the standard protocol without intravenous contrast.  Comparison: None.  Findings: The visualized lung bases are clear.  The liver and spleen are unremarkable in appearance.  The gallbladder is within normal limits.  The pancreas and adrenal glands are unremarkable.  Scattered tiny bilateral renal stones are seen, slightly more prominent on the right.  The kidneys are otherwise unremarkable in appearance.  There is no evidence of hydronephrosis.  No obstructing ureteral stones are identified.  No significant perinephric stranding is seen.  The small bowel is unremarkable in appearance.  The stomach is within normal limits.  No acute vascular abnormalities are seen.  The  patient is status post appendectomy.  The colon is partially filled with stool and is unremarkable in appearance.  The bladder is mildly distended; mild bladder wall thickening could reflect mild cystitis, or could remain within normal limits.  A urachal remnant is incidentally noted.  A small amount of free fluid is noted within the pelvis, of uncertain etiology.  The prostate remains normal in size.  No inguinal lymphadenopathy is seen.  No acute osseous abnormalities are identified.  IMPRESSION:  1.  Small amount of free fluid noted within the pelvis, of uncertain etiology. 2.  No evidence of hydronephrosis.  No obstructing ureteral stones seen. 3.  Scattered tiny bilateral renal stones seen, slightly more prominent on the right. 4.  Mild bladder wall thickening could reflect mild cystitis, given the patient's symptoms, or could remain within normal limits.   Original Report Authenticated By: Tonia Ghent, M.D.    Dg Lumbar Spine Complete  05/27/2012  *RADIOLOGY REPORT*  Clinical Data: Lower back pain, and bilateral leg and foot numbness.  LUMBAR SPINE - COMPLETE 4+ VIEW  Comparison: CT of the abdomen and pelvis performed earlier today at 06:09 a.m.  Findings: There is no evidence of fracture or subluxation. Vertebral bodies demonstrate normal height and alignment. Intervertebral disc spaces are preserved.  The visualized neural foramina are grossly unremarkable in appearance.  The visualized bowel gas pattern is unremarkable in appearance; air and stool are noted within the colon.  The sacroiliac joints are within normal limits.  IMPRESSION: No evidence of fracture or subluxation along the lumbar spine.   Original Report Authenticated By: Tonia Ghent, M.D.      1. Suicidal ideation   2. Bipolar disorder       MDM  22yo M presents w/ h/o bipolar disorder presents w/ SI.  Non-compliant w/ meds d/t finances.  Medically cleared.  Telepsych consult ordered.  Holding orders written.    Tele-psychiatrist recommends discharge w/ prescription for abilify 5mg  bid.  I agree w/ plan to discharge.  I did not get the impression that patient is truly suicidal.  He said specifically "I don't want to act on these thoughts" and he had no plan.  Pt has been seen for back pain in the ED twice in the last 2 days and was thought to have factitious disease or to be malingering.  There was no mention of depression or notably depressed mood on exam at either visit. I was concerned that pt may not be able to afford abilify, since he  attributed his non-compliance to financial burden, but it appears that he has been on this medication in the past and he now denies inability to pay for his meds.  4:01 AM       Otilio Miu, PA-C 05/28/12 0405

## 2012-05-27 NOTE — ED Provider Notes (Signed)
History     CSN: [id number]  Arrival date & time 05/26/12  Ernestina Columbia   First MD Initiated Contact with Patient 05/26/12 2132      Chief Complaint  Patient presents with  . Back Pain    (Consider location/radiation/quality/duration/timing/severity/associated sxs/prior treatment) HPI 22 year old male presents to emergency room complaining of severe low back pain and paralysis and paresthesias of bilateral lower extremities. Patient reports symptoms started around 6 PM tonight. He reports he was walking and he stretched and heard a pop in his back. After that point he was unable to feel or move his lower extremities. Patient did not collapse to the ground. He reports he's not had to urinate or have a bowel movement since onset of pain. Patient reports remote football injury in the past, at that time he had to lay in bed for 3 weeks. He has never had an MRI. Of note, patient was seen 12 hours ago in the emergency department for left flank pain. He reports that pain has nothing to do with the pain he has currently.  Patient reports numbness goes up into his hips bilaterally. He reports his buttocks are numb. He has severe pain from his mid back extending down into his legs. He denies any fever. No acute trauma. No night sweats, no history of tuberculosis or HIV. Past Medical History  Diagnosis Date  . Asthma   . Bipolar 1 disorder   . Depression   . Lower back injury     Past Surgical History  Procedure Date  . Appendectomy     No family history on file.  History  Substance Use Topics  . Smoking status: Smoker, Current Status Unknown -- 2.0 packs/day for 5 years    Types: Cigarettes  . Smokeless tobacco: Current User  . Alcohol Use: No      Review of Systems  See History of Present Illness; otherwise all other systems are reviewed and negative  Allergies  Morphine and related and Penicillins  Home Medications   Current Outpatient Rx  Name  Route  Sig  Dispense  Refill  .  ALBUTEROL SULFATE HFA 108 (90 BASE) MCG/ACT IN AERS   Inhalation   Inhale 2 puffs into the lungs 2 (two) times daily as needed for wheezing or shortness of breath. For wheezing   1 Inhaler   0     BP 113/66  Pulse 58  Temp 98 F (36.7 C) (Oral)  Resp 18  SpO2 98%  Physical Exam  Nursing note and vitals reviewed. Constitutional: He is oriented to person, place, and time. He appears well-developed and well-nourished. No distress.  HENT:  Head: Normocephalic and atraumatic.  Nose: Nose normal.  Mouth/Throat: Oropharynx is clear and moist.  Eyes: Conjunctivae normal and EOM are normal. Pupils are equal, round, and reactive to light.  Neck: Normal range of motion. Neck supple. No JVD present. No tracheal deviation present. No thyromegaly present.  Cardiovascular: Normal rate, regular rhythm, normal heart sounds and intact distal pulses.  Exam reveals no gallop and no friction rub.   No murmur heard. Pulmonary/Chest: Effort normal and breath sounds normal. No stridor. No respiratory distress. He has no wheezes. He has no rales. He exhibits no tenderness.  Abdominal: Soft. Bowel sounds are normal. He exhibits no distension and no mass. There is no tenderness. There is no rebound and no guarding.  Genitourinary: Rectum normal.  Musculoskeletal: He exhibits tenderness. He exhibits no edema.       Patient is able  to roll over in the bed for his rectal exam with pain but without paralysis noted in lower extremities. When patient rolls himself back flat, it is noted that both of his legs are raised in the air. Patient reports he cannot move them down to rest him on the bed because he is paralyzed. Straight leg rise raise is positive on both sides. With minimal assistance, patient is able to flex at the hip and the knee. Patient withdraws to painful stimuli of the feet. He has normal Babinski. He has normal rectal tone. When he is informed that he will need an in and out catheterization, patient is  able to ambulate to the bathroom  Lymphadenopathy:    He has no cervical adenopathy.  Neurological: He is alert and oriented to person, place, and time. He has normal reflexes. He displays normal reflexes. No cranial nerve deficit. He exhibits normal muscle tone. Coordination normal.  Skin: Skin is dry. No rash noted. No erythema. No pallor.  Psychiatric: He has a normal mood and affect. His behavior is normal. Judgment and thought content normal.    ED Course  Procedures (including critical care time)  Labs Reviewed - No data to display Ct Abdomen Pelvis Wo Contrast  05/26/2012  *RADIOLOGY REPORT*  Clinical Data: Left flank pain and hematuria.  CT ABDOMEN AND PELVIS WITHOUT CONTRAST  Technique:  Multidetector CT imaging of the abdomen and pelvis was performed following the standard protocol without intravenous contrast.  Comparison: None.  Findings: The visualized lung bases are clear.  The liver and spleen are unremarkable in appearance.  The gallbladder is within normal limits.  The pancreas and adrenal glands are unremarkable.  Scattered tiny bilateral renal stones are seen, slightly more prominent on the right.  The kidneys are otherwise unremarkable in appearance.  There is no evidence of hydronephrosis.  No obstructing ureteral stones are identified.  No significant perinephric stranding is seen.  The small bowel is unremarkable in appearance.  The stomach is within normal limits.  No acute vascular abnormalities are seen.  The patient is status post appendectomy.  The colon is partially filled with stool and is unremarkable in appearance.  The bladder is mildly distended; mild bladder wall thickening could reflect mild cystitis, or could remain within normal limits.  A urachal remnant is incidentally noted.  A small amount of free fluid is noted within the pelvis, of uncertain etiology.  The prostate remains normal in size.  No inguinal lymphadenopathy is seen.  No acute osseous abnormalities  are identified.  IMPRESSION:  1.  Small amount of free fluid noted within the pelvis, of uncertain etiology. 2.  No evidence of hydronephrosis.  No obstructing ureteral stones seen. 3.  Scattered tiny bilateral renal stones seen, slightly more prominent on the right. 4.  Mild bladder wall thickening could reflect mild cystitis, given the patient's symptoms, or could remain within normal limits.   Original Report Authenticated By: Tonia Ghent, M.D.    Dg Lumbar Spine Complete  05/27/2012  *RADIOLOGY REPORT*  Clinical Data: Lower back pain, and bilateral leg and foot numbness.  LUMBAR SPINE - COMPLETE 4+ VIEW  Comparison: CT of the abdomen and pelvis performed earlier today at 06:09 a.m.  Findings: There is no evidence of fracture or subluxation. Vertebral bodies demonstrate normal height and alignment. Intervertebral disc spaces are preserved.  The visualized neural foramina are grossly unremarkable in appearance.  The visualized bowel gas pattern is unremarkable in appearance; air and stool are noted within  the colon.  The sacroiliac joints are within normal limits.  IMPRESSION: No evidence of fracture or subluxation along the lumbar spine.   Original Report Authenticated By: Tonia Ghent, M.D.      1. Back pain       MDM  22 year old male with acute on chronic back pain who reported paralysis and paresthesia, but has normal exam. Suspect factitious disorder versus malingering.       Patient has been able to ambulate to the bathroom after being told he would need an in out cath. Patient reports now his back pain is better. X-rays unremarkable. Will discharge home.  Olivia Mackie, MD 05/27/12 [phone]

## 2012-05-27 NOTE — ED Notes (Signed)
Pt states he is having thoughts of hurting himself. Onset today. Pt states he is having these thoughts d/t money issue with boss.

## 2012-05-28 ENCOUNTER — Encounter (HOSPITAL_COMMUNITY): Payer: Self-pay | Admitting: *Deleted

## 2012-05-28 ENCOUNTER — Emergency Department (HOSPITAL_COMMUNITY)
Admission: EM | Admit: 2012-05-28 | Discharge: 2012-05-29 | Disposition: A | Discharge status: Home / Self Care | Payer: Self-pay | Attending: Emergency Medicine | Admitting: Emergency Medicine

## 2012-05-28 DIAGNOSIS — Z5900 Homelessness unspecified: Secondary | ICD-10-CM | POA: Insufficient documentation

## 2012-05-28 DIAGNOSIS — Z79899 Other long term (current) drug therapy: Secondary | ICD-10-CM | POA: Insufficient documentation

## 2012-05-28 DIAGNOSIS — Z87828 Personal history of other (healed) physical injury and trauma: Secondary | ICD-10-CM | POA: Insufficient documentation

## 2012-05-28 DIAGNOSIS — F17200 Nicotine dependence, unspecified, uncomplicated: Secondary | ICD-10-CM | POA: Insufficient documentation

## 2012-05-28 DIAGNOSIS — F319 Bipolar disorder, unspecified: Secondary | ICD-10-CM | POA: Insufficient documentation

## 2012-05-28 DIAGNOSIS — J45909 Unspecified asthma, uncomplicated: Secondary | ICD-10-CM | POA: Insufficient documentation

## 2012-05-28 DIAGNOSIS — F1210 Cannabis abuse, uncomplicated: Secondary | ICD-10-CM | POA: Insufficient documentation

## 2012-05-28 DIAGNOSIS — R45851 Suicidal ideations: Secondary | ICD-10-CM | POA: Insufficient documentation

## 2012-05-28 LAB — CBC WITH DIFFERENTIAL/PLATELET
Basophils Absolute: 0 10*3/uL (ref 0.0–0.1)
Basophils Relative: 1 % (ref 0–1)
Eosinophils Relative: 4 % (ref 0–5)
Lymphocytes Relative: 35 % (ref 12–46)
MCHC: 33.4 g/dL (ref 30.0–36.0)
Neutro Abs: 3.1 10*3/uL (ref 1.7–7.7)
Platelets: 195 10*3/uL (ref 150–400)
RDW: 13.7 % (ref 11.5–15.5)
WBC: 5.8 10*3/uL (ref 4.0–10.5)

## 2012-05-28 LAB — RAPID URINE DRUG SCREEN, HOSP PERFORMED
Amphetamines: NOT DETECTED
Barbiturates: NOT DETECTED
Benzodiazepines: POSITIVE — AB
Cocaine: NOT DETECTED
Opiates: NOT DETECTED
Tetrahydrocannabinol: POSITIVE — AB

## 2012-05-28 LAB — COMPREHENSIVE METABOLIC PANEL
ALT: 30 U/L (ref 0–53)
AST: 21 U/L (ref 0–37)
Albumin: 4.4 g/dL (ref 3.5–5.2)
CO2: 26 mEq/L (ref 19–32)
Calcium: 9.9 mg/dL (ref 8.4–10.5)
GFR calc non Af Amer: >90 mL/min (ref >90)
Sodium: 137 mEq/L (ref 135–145)

## 2012-05-28 MED ORDER — TRAZODONE HCL 50 MG PO TABS
150.0000 mg | ORAL_TABLET | Freq: Every day | ORAL | Status: DC
  Administered 2012-05-28: 150 mg via ORAL
  Filled 2012-05-28: qty 3

## 2012-05-28 MED ORDER — ACETAMINOPHEN 325 MG PO TABS: 650.0000 mg | ORAL_TABLET | ORAL | Status: DC | PRN

## 2012-05-28 MED ORDER — ARIPIPRAZOLE 10 MG PO TABS: 5.0000 mg | ORAL_TABLET | Freq: Every day | ORAL | Status: DC

## 2012-05-28 MED ORDER — IBUPROFEN 600 MG PO TABS: 600.0000 mg | ORAL_TABLET | Freq: Three times a day (TID) | ORAL | Status: DC | PRN

## 2012-05-28 MED ORDER — QUETIAPINE FUMARATE 100 MG PO TABS
200.0000 mg | ORAL_TABLET | Freq: Three times a day (TID) | ORAL | Status: DC
  Administered 2012-05-28 – 2012-05-29 (×2): 200 mg via ORAL
  Filled 2012-05-28 (×2): qty 2

## 2012-05-28 MED ORDER — ARIPIPRAZOLE 5 MG PO TABS
5.0000 mg | ORAL_TABLET | Freq: Two times a day (BID) | ORAL | Status: DC
  Administered 2012-05-28 – 2012-05-29 (×2): 5 mg via ORAL
  Filled 2012-05-28 (×4): qty 1

## 2012-05-28 MED ORDER — ONDANSETRON HCL 4 MG PO TABS
4.0000 mg | ORAL_TABLET | Freq: Three times a day (TID) | ORAL | Status: DC | PRN
Start: 2012-05-28 — End: 2012-05-29

## 2012-05-28 MED ORDER — NICOTINE 21 MG/24HR TD PT24: 21.0000 mg | MEDICATED_PATCH | Freq: Every day | TRANSDERMAL | Status: DC | PRN

## 2012-05-28 MED ORDER — ZOLPIDEM TARTRATE 5 MG PO TABS
5.0000 mg | ORAL_TABLET | Freq: Every evening | ORAL | Status: DC | PRN
  Administered 2012-05-28: 5 mg via ORAL
  Filled 2012-05-28: qty 1

## 2012-05-28 MED ORDER — ALBUTEROL SULFATE HFA 108 (90 BASE) MCG/ACT IN AERS: 2.0000 | INHALATION_SPRAY | Freq: Two times a day (BID) | RESPIRATORY_TRACT | Status: DC | PRN

## 2012-05-28 MED ORDER — ALUM & MAG HYDROXIDE-SIMETH 200-200-20 MG/5ML PO SUSP: 30.0000 mL | ORAL | Status: DC | PRN

## 2012-05-28 NOTE — ED Provider Notes (Signed)
Medical screening examination/treatment/procedure(s) were performed by non-physician practitioner and as supervising physician I was immediately available for consultation/collaboration.  Hammad Finkler, MD 05/28/12 1532 

## 2012-05-28 NOTE — Progress Notes (Signed)
CSW spoke with the Pt at bedside.   CSW assessed Pt needs for d/c planning. Pt stated that he currently is residing with a friend and does not need placement in a homeless shelter.   CSW also assessed if Pt needed assistance with obtaining medications and Pt stated he currently does have accessibility to medications and does not need assistance at this time.   No CSW needs at this time, however Pt was provided with multiple resources for financial assistance, food assistance, and homeless shelters in local area.   Pt was appreciative for assistance.   CSW signing off. Please contact CSW if any further needs arise.   Leron Croak, LCSWA [employer] [phone]

## 2012-05-28 NOTE — ED Notes (Signed)
Pt given sandwich and drink, belongings to locker #40 x3 bags, check by security, pt wanded by security and changed into scrubs.

## 2012-05-28 NOTE — ED Notes (Signed)
MD Effie Shy at bedside to assess patient.

## 2012-05-28 NOTE — ED Notes (Signed)
Pt verbalizes understanding 

## 2012-05-28 NOTE — ED Notes (Signed)
Dr Fonnie Jarvis to look into holding orders for this pt.

## 2012-05-28 NOTE — ED Notes (Signed)
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 Pt denies HI, denies specific plain for suicidal ideation. Denies ETOH or drug use. States he is compliant with his psychiatric medications. Pt calm and cooperative at this time.

## 2012-05-28 NOTE — ED Notes (Signed)
Given sandwich and coke

## 2012-05-28 NOTE — ED Provider Notes (Signed)
History     CSN: [id number]  Arrival date & time 05/28/12  1503   First MD Initiated Contact with Patient 05/28/12 1530      Chief Complaint  Patient presents with  . Medical Clearance    (Consider location/radiation/quality/duration/timing/severity/associated sxs/prior treatment) HPI Comments: Nathan Poole is a 22 y.o. Male who returns for continued suicidal ideation, with plan to cut wrist. He is currently homeless, stuck in [place], for several days, and off of all his medicines for at least 4 days. Yesterday, he was in the ED, was seen by Telepsych, given a prescription for Abilify, but did not fill it because he could not afford it. He will not stay, where he stayed last night. He lives with his mother in [place]. He, states that she will not come and pick him up. He declares that he does not currently see a therapist or psychiatrist. He has no other complaints. There are no other modifying factors.  The history is provided by the patient.    Past Medical History  Diagnosis Date  . Asthma   . Bipolar 1 disorder   . Depression   . Lower back injury     Past Surgical History  Procedure Date  . Appendectomy   . Bowel obstruction     History reviewed. No pertinent family history.  History  Substance Use Topics  . Smoking status: Smoker, Current Status Unknown -- 2.0 packs/day for 5 years    Types: Cigarettes  . Smokeless tobacco: Current User  . Alcohol Use: No      Review of Systems  All other systems reviewed and are negative.    Allergies  Morphine and related and Penicillins  Home Medications   Current Outpatient Rx  Name  Route  Sig  Dispense  Refill  . ALBUTEROL SULFATE HFA 108 (90 BASE) MCG/ACT IN AERS   Inhalation   Inhale 2 puffs into the lungs 2 (two) times daily as needed for wheezing or shortness of breath. For wheezing   1 Inhaler   0   . ARIPIPRAZOLE 10 MG PO TABS   Oral   Take 0.5 tablets (5 mg total) by mouth daily.   30  tablet   0   . QUETIAPINE FUMARATE 200 MG PO TABS   Oral   Take 200 mg by mouth 3 (three) times daily.         . TRAZODONE HCL 150 MG PO TABS   Oral   Take 150 mg by mouth at bedtime.           BP 95/55  Pulse 71  Temp 98.6 F (37 C) (Oral)  Resp 20  SpO2 97%  Physical Exam  Nursing note and vitals reviewed. Constitutional: He is oriented to person, place, and time. He appears well-developed and well-nourished. No distress.  HENT:  Head: Normocephalic and atraumatic.  Right Ear: External ear normal.  Left Ear: External ear normal.  Eyes: Conjunctivae normal and EOM are normal. Pupils are equal, round, and reactive to light.  Neck: Normal range of motion and phonation normal. Neck supple.  Cardiovascular: Normal rate, regular rhythm, normal heart sounds and intact distal pulses.   Pulmonary/Chest: Effort normal and breath sounds normal. He exhibits no bony tenderness.  Abdominal: Soft. Normal appearance. There is no tenderness.  Musculoskeletal: Normal range of motion.  Neurological: He is alert and oriented to person, place, and time. He has normal strength. No cranial nerve deficit or sensory deficit. He exhibits normal muscle tone.  Coordination normal.  Skin: Skin is warm, dry and intact.  Psychiatric: His behavior is normal.       Mildly agitated    ED Course  Procedures (including critical care time)  He is offered food and a blanket.  Consultations with social services for domicile and medication assisstance.  Patient is not a clear need for and other psychiatric assessment. Will proceed with swelling. Some of his social issues then reassess for psychiatric issues.  Record review indicates that he is admitted for 4 days, at the behavioral health hospital. During the stay. He did not participate in group activities. He refused to take his medications. He improved, and was discharged. At discharge, she told a provider, that he would not be taking his  medications. At discharge his medications were Abilify, Depakote, Seroquel, Risperdal, and trazodone. Apparently, the inciting event for the admission was an argument with his mother, after which he was kicked out of her home. [place].   Social worker saw the patient and he told her that he did have a place to stay and did. His medicines. I interviewed him after that, and he, again to me that he did not have a place to stay, and did not have his medicines.  ACT consult   Labs Reviewed  CBC WITH DIFFERENTIAL - Abnormal; Notable for the following:    Hemoglobin 12.7 (*)     HCT 38.0 (*)     All other components within normal limits  URINE RAPID DRUG SCREEN (HOSP PERFORMED) - Abnormal; Notable for the following:    Benzodiazepines POSITIVE (*)     Tetrahydrocannabinol POSITIVE (*)     All other components within normal limits  COMPREHENSIVE METABOLIC PANEL  ETHANOL   Dg Lumbar Spine Complete  05/27/2012  *RADIOLOGY REPORT*  Clinical Data: Lower back pain, and bilateral leg and foot numbness.  LUMBAR SPINE - COMPLETE 4+ VIEW  Comparison: CT of the abdomen and pelvis performed earlier today at 06:09 a.m.  Findings: There is no evidence of fracture or subluxation. Vertebral bodies demonstrate normal height and alignment. Intervertebral disc spaces are preserved.  The visualized neural foramina are grossly unremarkable in appearance.  The visualized bowel gas pattern is unremarkable in appearance; air and stool are noted within the colon.  The sacroiliac joints are within normal limits.  IMPRESSION: No evidence of fracture or subluxation along the lumbar spine.   Original Report Authenticated By: Tonia Ghent, M.D.      1. Suicidal ideation   2. Homelessness       MDM  Suicidal ideation, with social stressors, as well as borderline mental functioning per prior notes. He does not appear to be able to be placed  psychiatrically, at this time. This is the  patient's fourth ED visit in the last 72 hours.   Care to oncoming ED providers- 00:15  Flint Melter, MD 05/29/12 [phone]

## 2012-05-28 NOTE — ED Notes (Signed)
telepsych faxed and called 

## 2012-05-28 NOTE — ED Notes (Signed)
CSW into see 

## 2012-05-29 NOTE — ED Notes (Signed)
telepsych info has been faxed  And called

## 2012-05-29 NOTE — ED Provider Notes (Addendum)
Filed Vitals:   05/29/12 0635  BP: 95/55  Pulse: 69  Temp: 97.8 F (36.6 C)  Resp: 20   Pt seen this am. Cooperative. Currently voluntary. Denying SI. Does have social stressors and resides with mother in [place]. Says he would like to leave and could take bus. Pt with some inconsistencies with his story. Telling me he is compliant with his medications although other provider noted mentioning that he is not. Recent evaluations for similar. Some concern for possible malingering. Telepsych consult ordered to help facilitate disposition.   Raeford Razor, MD 05/29/12 [phone]  Psychiatry consultation was reviewed. They feel that patient is psychiatrically stable for discharge. He is medically stable as well. Do not recommend any further medication recommendations aside from patient actually taking the medicines that are prescribed to him. We'll be discharged with outpatient resources as well.  Raeford Razor, MD 05/29/12 [phone]
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
## 2012-05-29 NOTE — ED Notes (Signed)
Pt dc'd w/ written instructions and local shelter information.  Pt reports that he can not return to any of the [facility], but would try the other locations.  Pt encouraged to follow up and take his medications as directed.  Pt verbalized understanding.  Bus pass given, belongings returned after leaving the unit.  Pt ambulatory to DC window w/o difficulty.

## 2012-05-29 NOTE — ED Notes (Signed)
telepsych in progress 

## 2012-06-13 ENCOUNTER — Encounter (HOSPITAL_COMMUNITY): Payer: Self-pay | Admitting: Emergency Medicine

## 2012-06-13 ENCOUNTER — Emergency Department (HOSPITAL_COMMUNITY)
Admission: EM | Admit: 2012-06-13 | Discharge: 2012-06-14 | Disposition: A | Discharge status: Home / Self Care | Payer: Self-pay | Attending: Emergency Medicine | Admitting: Emergency Medicine

## 2012-06-13 DIAGNOSIS — F17200 Nicotine dependence, unspecified, uncomplicated: Secondary | ICD-10-CM | POA: Insufficient documentation

## 2012-06-13 DIAGNOSIS — IMO0002 Reserved for concepts with insufficient information to code with codable children: Secondary | ICD-10-CM | POA: Insufficient documentation

## 2012-06-13 DIAGNOSIS — F3181 Bipolar II disorder: Secondary | ICD-10-CM

## 2012-06-13 DIAGNOSIS — F3189 Other bipolar disorder: Secondary | ICD-10-CM | POA: Insufficient documentation

## 2012-06-13 DIAGNOSIS — Z87828 Personal history of other (healed) physical injury and trauma: Secondary | ICD-10-CM | POA: Insufficient documentation

## 2012-06-13 DIAGNOSIS — J45909 Unspecified asthma, uncomplicated: Secondary | ICD-10-CM | POA: Insufficient documentation

## 2012-06-13 DIAGNOSIS — Z79899 Other long term (current) drug therapy: Secondary | ICD-10-CM | POA: Insufficient documentation

## 2012-06-13 DIAGNOSIS — X789XXA Intentional self-harm by unspecified sharp object, initial encounter: Secondary | ICD-10-CM | POA: Insufficient documentation

## 2012-06-13 DIAGNOSIS — R45851 Suicidal ideations: Secondary | ICD-10-CM | POA: Insufficient documentation

## 2012-06-13 DIAGNOSIS — T07XXXA Unspecified multiple injuries, initial encounter: Secondary | ICD-10-CM

## 2012-06-13 LAB — COMPREHENSIVE METABOLIC PANEL
Alkaline Phosphatase: 79 U/L (ref 39–117)
BUN: 18 mg/dL (ref 6–23)
GFR calc Af Amer: >90 mL/min (ref >90)
Glucose, Bld: 101 mg/dL — ABNORMAL HIGH (ref 70–99)
Potassium: 4.4 mEq/L (ref 3.5–5.1)
Total Bilirubin: 0.7 mg/dL (ref 0.3–1.2)
Total Protein: 8.1 g/dL (ref 6.0–8.3)

## 2012-06-13 LAB — ETHANOL: Alcohol, Ethyl (B): <11 mg/dL (ref 0–11)

## 2012-06-13 LAB — CBC
HCT: 38.8 % — ABNORMAL LOW (ref 39.0–52.0)
Hemoglobin: 13.2 g/dL (ref 13.0–17.0)
MCHC: 34 g/dL (ref 30.0–36.0)
MCV: 87.4 fL (ref 78.0–100.0)

## 2012-06-13 LAB — RAPID URINE DRUG SCREEN, HOSP PERFORMED
Barbiturates: NOT DETECTED
Cocaine: NOT DETECTED
Opiates: NOT DETECTED

## 2012-06-13 MED ORDER — IBUPROFEN 600 MG PO TABS: 600.0000 mg | ORAL_TABLET | Freq: Three times a day (TID) | ORAL | Status: DC | PRN

## 2012-06-13 MED ORDER — TETANUS-DIPHTH-ACELL PERTUSSIS 5-2.5-18.5 LF-MCG/0.5 IM SUSP: 0.5000 mL | Freq: Once | INTRAMUSCULAR | Status: DC

## 2012-06-13 MED ORDER — NICOTINE 21 MG/24HR TD PT24: 21.0000 mg | MEDICATED_PATCH | Freq: Every day | TRANSDERMAL | Status: DC

## 2012-06-13 MED ORDER — ALUM & MAG HYDROXIDE-SIMETH 200-200-20 MG/5ML PO SUSP: 30.0000 mL | ORAL | Status: DC | PRN

## 2012-06-13 MED ORDER — LORAZEPAM 1 MG PO TABS: 1.0000 mg | ORAL_TABLET | Freq: Three times a day (TID) | ORAL | Status: DC | PRN

## 2012-06-13 MED ORDER — ONDANSETRON HCL 4 MG PO TABS: 4.0000 mg | ORAL_TABLET | Freq: Three times a day (TID) | ORAL | Status: DC | PRN

## 2012-06-13 MED ORDER — ACETAMINOPHEN 325 MG PO TABS: 650.0000 mg | ORAL_TABLET | ORAL | Status: DC | PRN

## 2012-06-13 NOTE — ED Notes (Signed)
Per EMS, Patient was fired from his job today states he is homicidal and suicidal. Patient with multiple superficial cuts to left forearm. Denies pain.
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
## 2012-06-13 NOTE — ED Provider Notes (Signed)
History     CSN: [id number]  Arrival date & time 06/13/12  2227   First MD Initiated Contact with Patient 06/13/12 2315      Chief Complaint  Patient presents with  . Medical Clearance    (Consider location/radiation/quality/duration/timing/severity/associated sxs/prior treatment) HPI Hx per PT.  has bipolar, cut his L wrist with glass today after found he lost his job.  Still having suicidal thoughts. Admitted to [facility] in the past.  BIB EMS. Pain is sharp and mild. No active bleeding. Has not been taking any regular medications. Denies etoh or drug use.  Past Medical History  Diagnosis Date  . Asthma   . Bipolar 1 disorder   . Depression   . Lower back injury     Past Surgical History  Procedure Laterality Date  . Appendectomy    . Bowel obstruction      History reviewed. No pertinent family history.  History  Substance Use Topics  . Smoking status: Current Every Day Smoker -- 1.00 packs/day for 5 years    Types: Cigarettes  . Smokeless tobacco: Current User  . Alcohol Use: No      Review of Systems  Constitutional: Negative for fever and chills.  HENT: Negative for neck pain and neck stiffness.   Eyes: Negative for pain.  Respiratory: Negative for shortness of breath.   Cardiovascular: Negative for chest pain.  Gastrointestinal: Negative for abdominal pain.  Genitourinary: Negative for dysuria.  Musculoskeletal: Negative for back pain.  Skin: Positive for wound. Negative for rash.  Neurological: Negative for headaches.  All other systems reviewed and are negative.    Allergies  Morphine and related and Penicillins  Home Medications   Current Outpatient Rx  Name  Route  Sig  Dispense  Refill  . ALPRAZolam (XANAX) 0.25 MG tablet   Oral   Take 0.25 mg by mouth 3 (three) times daily as needed (anxiety).         . traZODone (DESYREL) 150 MG tablet   Oral   Take 150 mg by mouth at bedtime.           BP 105/62  Pulse 61  Temp(Src) 98.1 F  (36.7 C) (Oral)  SpO2 98%  Physical Exam  Constitutional: He is oriented to person, place, and time. He appears well-developed and well-nourished.  HENT:  Head: Normocephalic and atraumatic.  Eyes: EOM are normal. Pupils are equal, round, and reactive to light.  Neck: Neck supple.  Cardiovascular: Regular rhythm and intact distal pulses.   Pulmonary/Chest: Effort normal. No respiratory distress.  Musculoskeletal: Normal range of motion. He exhibits no edema.  Multiple superficial lacerations to left wrist and FA, no active bleeding, distal N/V intact. No other trauma  Neurological: He is alert and oriented to person, place, and time.  Skin: Skin is warm and dry.    ED Course  Procedures (including critical care time)  Results for orders placed during the hospital encounter of 06/13/12  URINE RAPID DRUG SCREEN (HOSP PERFORMED)      Result Value Range   Opiates NONE DETECTED  NONE DETECTED   Cocaine NONE DETECTED  NONE DETECTED   Benzodiazepines NONE DETECTED  NONE DETECTED   Amphetamines NONE DETECTED  NONE DETECTED   Tetrahydrocannabinol POSITIVE (*) NONE DETECTED   Barbiturates NONE DETECTED  NONE DETECTED  CBC      Result Value Range   WBC 7.4  4.0 - 10.5 K/uL   RBC 4.44  4.22 - 5.81 MIL/uL   Hemoglobin  13.2  13.0 - 17.0 g/dL   HCT 16.1 (*) 09.6 - 04.5 %   MCV 87.4  78.0 - 100.0 fL   MCH 29.7  26.0 - 34.0 pg   MCHC 34.0  30.0 - 36.0 g/dL   RDW 40.9  81.1 - 91.4 %   Platelets 189  150 - 400 K/uL  COMPREHENSIVE METABOLIC PANEL      Result Value Range   Sodium 137  135 - 145 mEq/L   Potassium 4.4  3.5 - 5.1 mEq/L   Chloride 100  96 - 112 mEq/L   CO2 28  19 - 32 mEq/L   Glucose, Bld 101 (*) 70 - 99 mg/dL   BUN 18  6 - 23 mg/dL   Creatinine, Ser 7.82  0.50 - 1.35 mg/dL   Calcium 9.6  8.4 - 95.6 mg/dL   Total Protein 8.1  6.0 - 8.3 g/dL   Albumin 4.5  3.5 - 5.2 g/dL   AST 16  0 - 37 U/L   ALT 18  0 - 53 U/L   Alkaline Phosphatase 79  39 - 117 U/L   Total Bilirubin  0.7  0.3 - 1.2 mg/dL   GFR calc non Af Amer >90  >90 mL/min   GFR calc Af Amer >90  >90 mL/min  ETHANOL      Result Value Range   Alcohol, Ethyl (B) <11  0 - 11 mg/dL  ACETAMINOPHEN LEVEL      Result Value Range   Acetaminophen (Tylenol), Serum <15.0  10 - 30 ug/mL  SALICYLATE LEVEL      Result Value Range   Salicylate Lvl <2.0 (*) 2.8 - 20.0 mg/dL   Tetanus updated, wound care  [facility] consult obtained - DR Penalver recommends OK for discharge and outpatient follow up mental health. PT agreeable to plan  MDM  SI with superficial abrasions h/o bipolar depression  Medically cleared  Psychiatrically cleared  VS and nursing notes reviewed  Labs and UA/ UDS reviewed  Stable for discharge      Sunnie Nielsen, MD 06/14/12 0406

## 2012-06-13 NOTE — ED Notes (Signed)
Writer found a big piece of glass in pts pants. Writer informed Financial planner.  Pt's belong bag behind the nurses station

## 2012-06-13 NOTE — ED Notes (Signed)
ZOX:[id number]<AV> Expected date:06/13/12<BR> Expected time:10:20 PM<BR> Means of arrival:Ambulance<BR> Comments:<BR> 22 yo M  Med clearance

## 2012-06-14 NOTE — ED Notes (Signed)
Pt becoming agitated and refused to speak further with physician.

## 2012-06-14 NOTE — ED Notes (Signed)
Patient states he has passive SI but no plan. Pt does verbally contract for safety while on unit.

## 2012-06-14 NOTE — ED Notes (Signed)
Telepsych consult currently in progress; pt cooperative with assessment.

## 2014-09-14 IMAGING — CT CT ABD-PELV W/O CM
1 of 2 series · 15 of 32 positions shown, 19 images · non-contrast
Comparison: None.

CLINICAL DATA: Left flank pain and hematuria.

CT ABDOMEN AND PELVIS WITHOUT CONTRAST
TECHNIQUE: Multidetector CT imaging of the abdomen and pelvis was
performed following the standard protocol without intravenous
contrast.

[Series 2: stone 130 5.0 b31f st · axial · 0.66mm/px · z∈[-510,-40]mm · 15 of 102 slices shown, 19 images]
[im 4/102  soft-tissue]
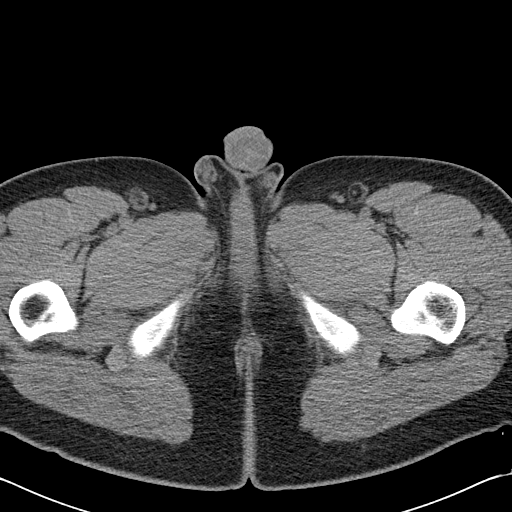
[im 4/102  bone]
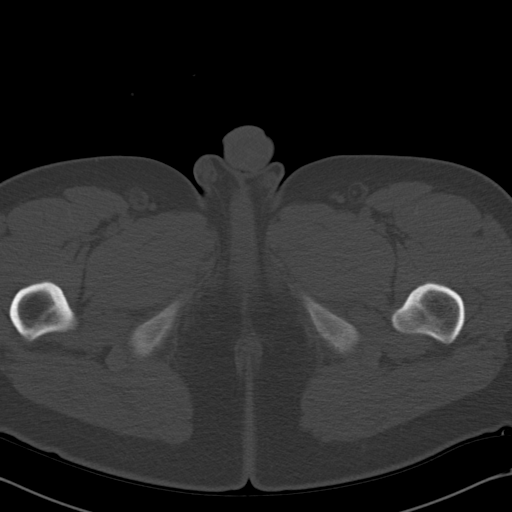
[im 12/102  soft-tissue]
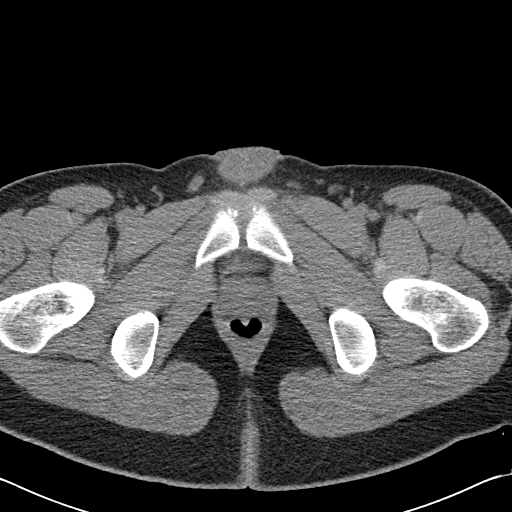
[im 20/102  soft-tissue]
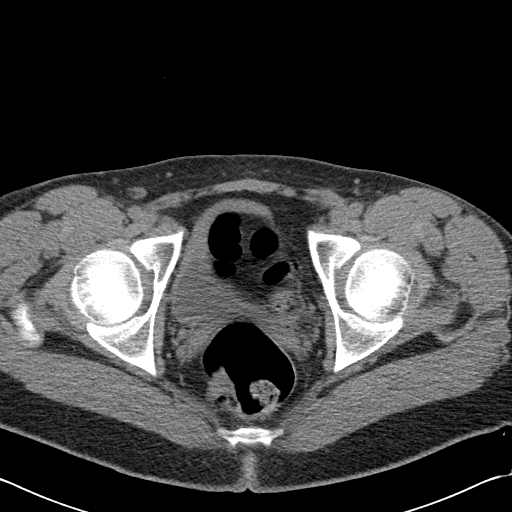
[im 28/102  soft-tissue]
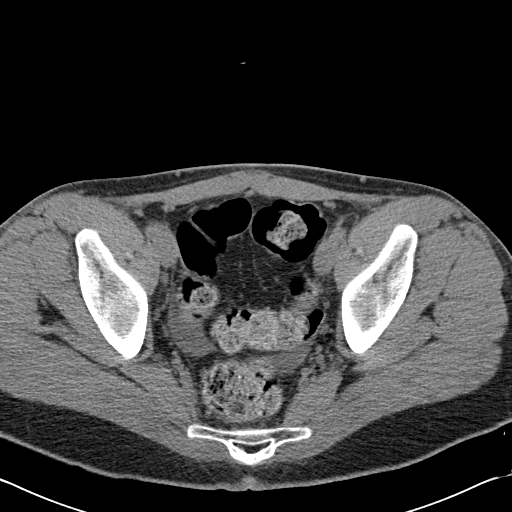
[im 35/102  soft-tissue]
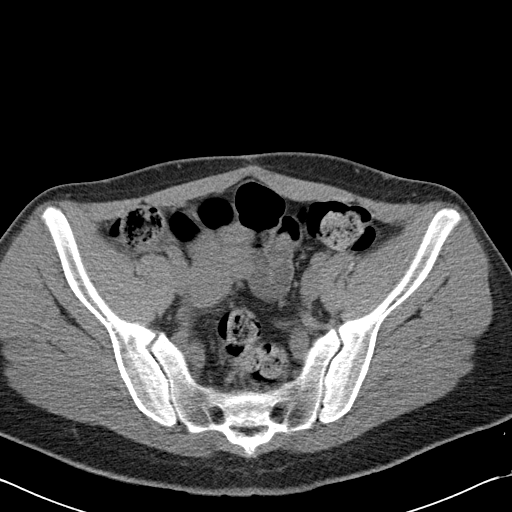
[im 43/102  soft-tissue]
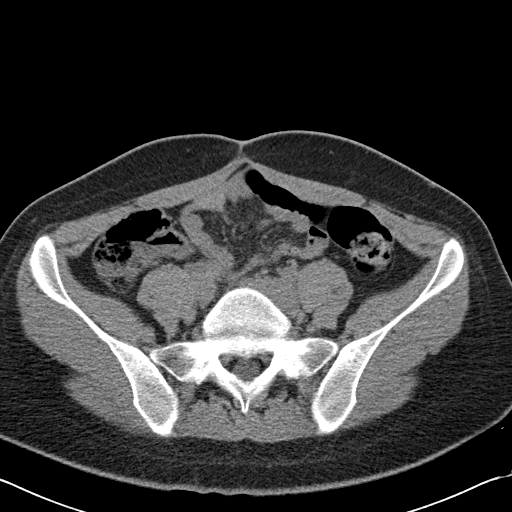
[im 51/102  soft-tissue]
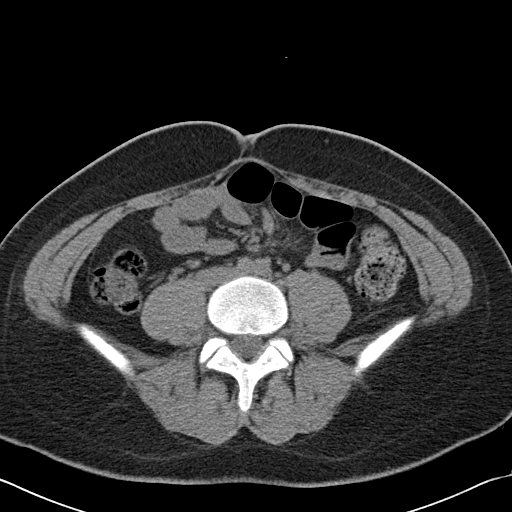
[im 59/102  soft-tissue]
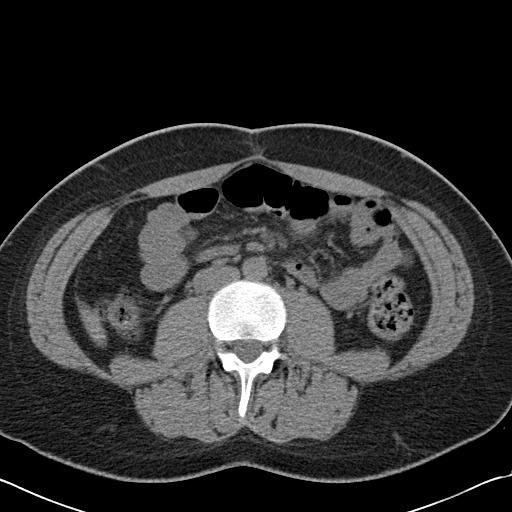
[im 67/102  soft-tissue]
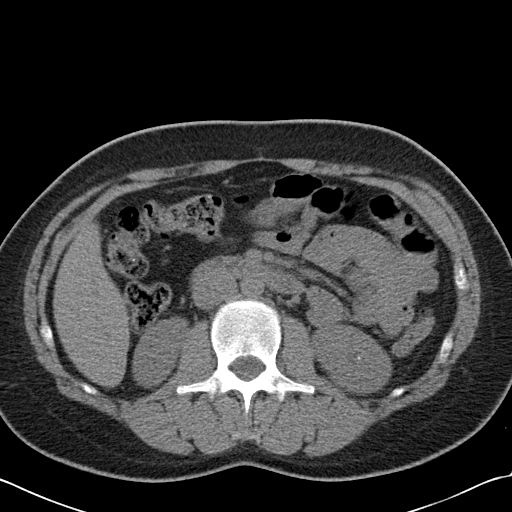
[im 67/102  bone]
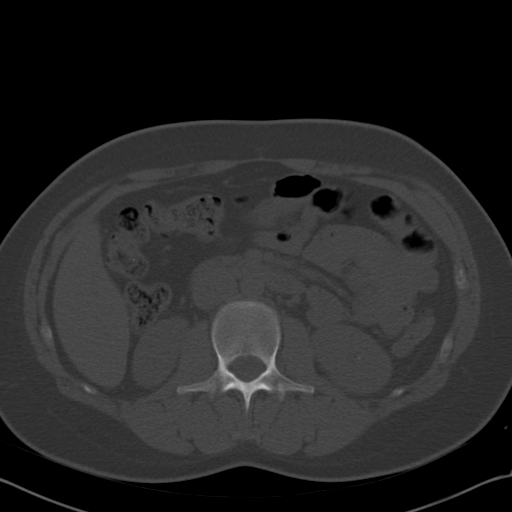
[im 74/102  soft-tissue]
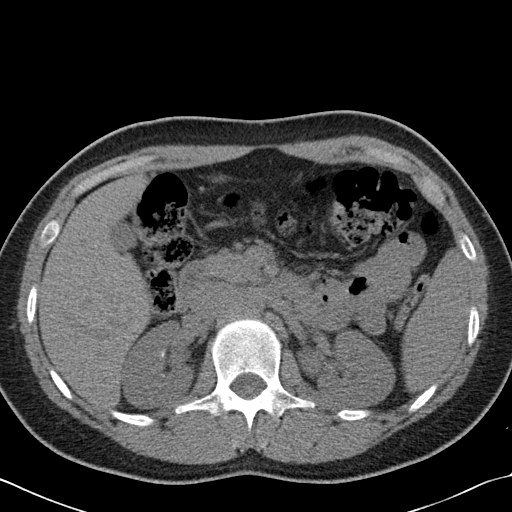
[im 82/102  soft-tissue]
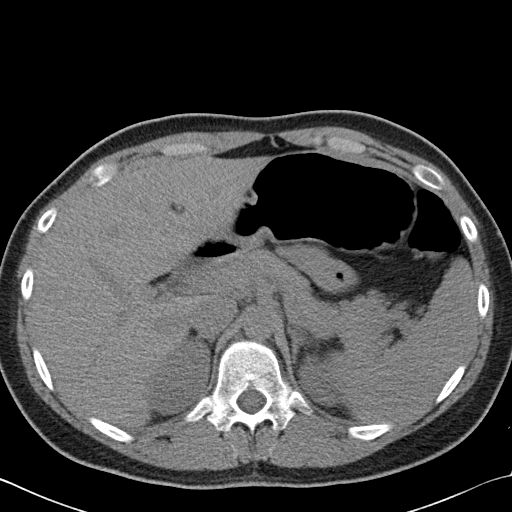
[im 86/102  lung]
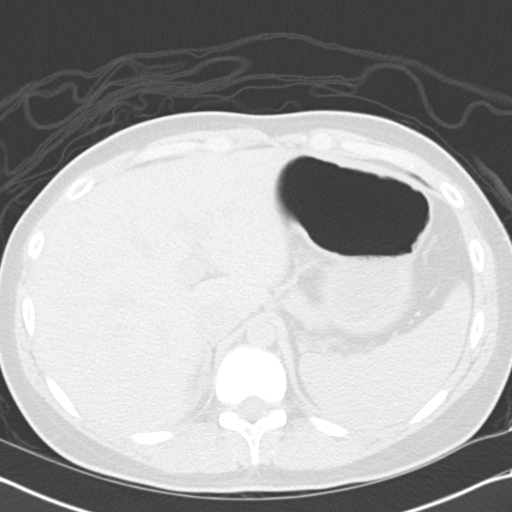
[im 90/102  soft-tissue]
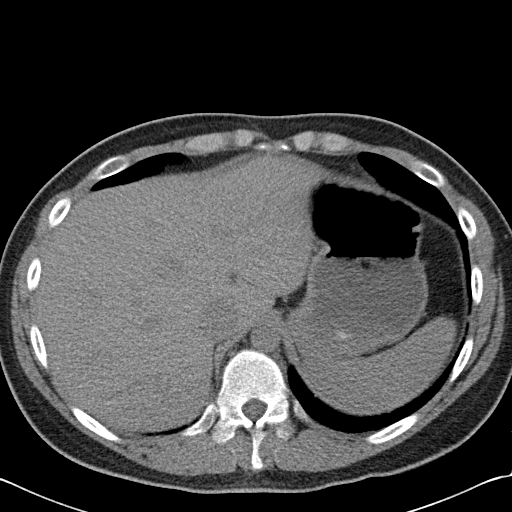
[im 90/102  lung]
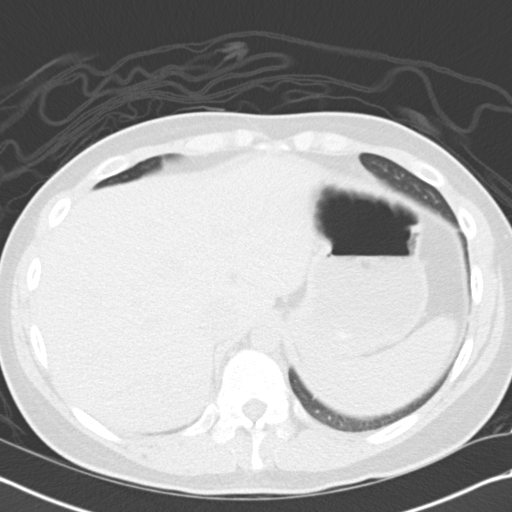
[im 94/102  lung]
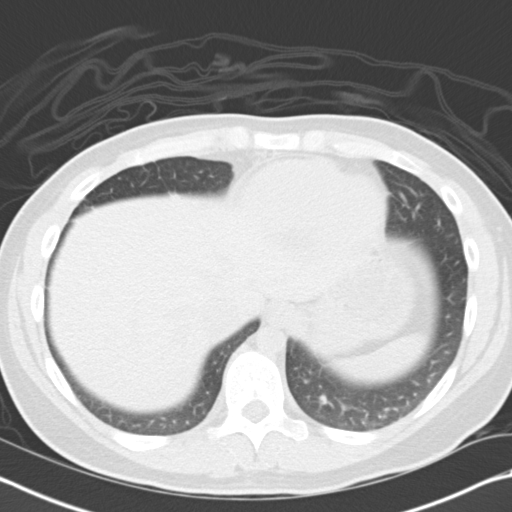
[im 98/102  soft-tissue]
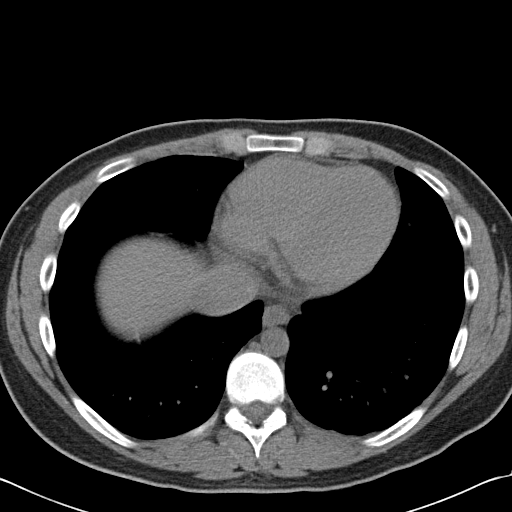
[im 98/102  lung]
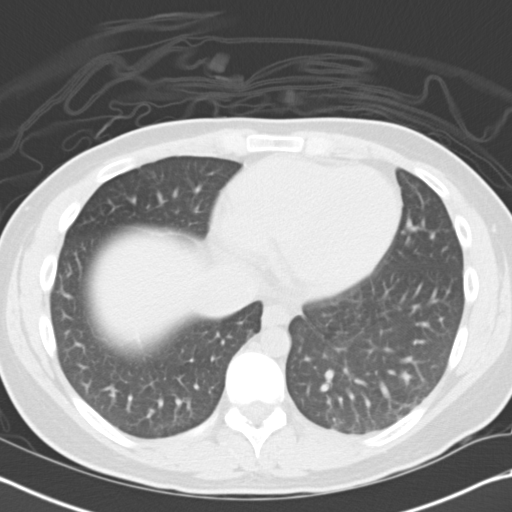

[15 of 32 positions shown; findings below may reference images not displayed]

FINDINGS: The visualized lung bases are clear.

The liver and spleen are unremarkable in appearance.  The
gallbladder is within normal limits.  The pancreas and adrenal
glands are unremarkable.

Scattered tiny bilateral renal stones are seen, slightly more
prominent on the right.  The kidneys are otherwise unremarkable in
appearance.  There is no evidence of hydronephrosis.  No
obstructing ureteral stones are identified.  No significant
perinephric stranding is seen.

The small bowel is unremarkable in appearance.  The stomach is
within normal limits.  No acute vascular abnormalities are seen.

The patient is status post appendectomy.  The colon is partially
filled with stool and is unremarkable in appearance.

The bladder is mildly distended; mild bladder wall thickening could
reflect mild cystitis, or could remain within normal limits.  A
urachal remnant is incidentally noted.  A small amount of free
fluid is noted within the pelvis, of uncertain etiology.  The
prostate remains normal in size.  No inguinal lymphadenopathy is
seen.

No acute osseous abnormalities are identified.
IMPRESSION: 1.  Small amount of free fluid noted within the pelvis, of
uncertain etiology.
2.  No evidence of hydronephrosis.  No obstructing ureteral stones
seen.
3.  Scattered tiny bilateral renal stones seen, slightly more
prominent on the right.
4.  Mild bladder wall thickening could reflect mild cystitis, given
the patient's symptoms, or could remain within normal limits.

## 2015-08-11 ENCOUNTER — Emergency Department (HOSPITAL_COMMUNITY)
Admission: EM | Admit: 2015-08-11 | Discharge: 2015-08-11 | Disposition: A | Discharge status: Home / Self Care | Payer: Self-pay | Attending: Emergency Medicine | Admitting: Emergency Medicine

## 2015-08-11 ENCOUNTER — Encounter (HOSPITAL_COMMUNITY): Payer: Self-pay | Admitting: *Deleted

## 2015-08-11 DIAGNOSIS — M79641 Pain in right hand: Secondary | ICD-10-CM | POA: Insufficient documentation

## 2015-08-11 DIAGNOSIS — F17210 Nicotine dependence, cigarettes, uncomplicated: Secondary | ICD-10-CM | POA: Insufficient documentation

## 2015-08-11 DIAGNOSIS — Z880 Allergy status to penicillin: Secondary | ICD-10-CM | POA: Insufficient documentation

## 2015-08-11 DIAGNOSIS — M7989 Other specified soft tissue disorders: Secondary | ICD-10-CM | POA: Insufficient documentation

## 2015-08-11 DIAGNOSIS — F329 Major depressive disorder, single episode, unspecified: Secondary | ICD-10-CM | POA: Insufficient documentation

## 2015-08-11 DIAGNOSIS — J45909 Unspecified asthma, uncomplicated: Secondary | ICD-10-CM | POA: Insufficient documentation

## 2015-08-11 DIAGNOSIS — Z79899 Other long term (current) drug therapy: Secondary | ICD-10-CM | POA: Insufficient documentation

## 2015-08-11 DIAGNOSIS — Z87828 Personal history of other (healed) physical injury and trauma: Secondary | ICD-10-CM | POA: Insufficient documentation

## 2015-08-11 NOTE — Progress Notes (Signed)
Orthopedic Tech Progress Note Patient Details:  Maryann AlarJonathan Soto May 20, 1990 [id number]  Ortho Devices Type of Ortho Device: Ulna gutter splint Ortho Device/Splint Location: rue Ortho Device/Splint Interventions: Ordered, Application   Trinna PostMartinez, Shron Ozer J 08/11/2015, 4:28 AM

## 2015-08-11 NOTE — ED Notes (Signed)
Discharge instructions reviewed - voiced understanding.  Instructed patient to keep splint on hand

## 2015-08-11 NOTE — Discharge Instructions (Signed)

## 2015-08-11 NOTE — ED Notes (Signed)
Hand cleaned with soap and water
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
## 2015-08-12 ENCOUNTER — Encounter (HOSPITAL_COMMUNITY): Payer: Self-pay

## 2015-08-12 ENCOUNTER — Emergency Department (HOSPITAL_COMMUNITY)
Admission: EM | Admit: 2015-08-12 | Discharge: 2015-08-12 | Disposition: A | Discharge status: Home / Self Care | Payer: Self-pay | Attending: Emergency Medicine | Admitting: Emergency Medicine

## 2015-08-12 DIAGNOSIS — M545 Low back pain: Secondary | ICD-10-CM | POA: Insufficient documentation

## 2015-08-12 DIAGNOSIS — Z79899 Other long term (current) drug therapy: Secondary | ICD-10-CM | POA: Insufficient documentation

## 2015-08-12 DIAGNOSIS — J45909 Unspecified asthma, uncomplicated: Secondary | ICD-10-CM | POA: Insufficient documentation

## 2015-08-12 DIAGNOSIS — Z87828 Personal history of other (healed) physical injury and trauma: Secondary | ICD-10-CM | POA: Insufficient documentation

## 2015-08-12 DIAGNOSIS — Z880 Allergy status to penicillin: Secondary | ICD-10-CM | POA: Insufficient documentation

## 2015-08-12 DIAGNOSIS — F17210 Nicotine dependence, cigarettes, uncomplicated: Secondary | ICD-10-CM | POA: Insufficient documentation

## 2015-08-12 DIAGNOSIS — F319 Bipolar disorder, unspecified: Secondary | ICD-10-CM | POA: Insufficient documentation

## 2015-08-12 MED ORDER — METHOCARBAMOL 500 MG PO TABS: 500.0000 mg | ORAL_TABLET | Freq: Three times a day (TID) | ORAL | Status: AC | PRN

## 2015-08-12 MED ORDER — IBUPROFEN 800 MG PO TABS: 800.0000 mg | ORAL_TABLET | Freq: Three times a day (TID) | ORAL | Status: AC | PRN

## 2015-08-12 MED ORDER — IBUPROFEN 800 MG PO TABS
800.0000 mg | ORAL_TABLET | Freq: Once | ORAL | Status: AC
  Administered 2015-08-12: 800 mg via ORAL
  Filled 2015-08-12: qty 1

## 2015-08-12 NOTE — ED Notes (Signed)
Pt comes via [facility], [place] and ans stated that his lower back "locked up", denies falling, pt wants to get it checked out before he goes back [place] tomorrow, pt is ambulatory.

## 2015-08-12 NOTE — Discharge Instructions (Signed)
Back Pain, Adult °Back pain is very common in adults. The cause of back pain is rarely dangerous and the pain often gets better over time. The cause of your back pain may not be known. Some common causes of back pain include: °· Strain of the muscles or ligaments supporting the spine. °· Wear and tear (degeneration) of the spinal disks. °· Arthritis. °· Direct injury to the back. °For many people, back pain may return. Since back pain is rarely dangerous, most people can learn to manage this condition on their own. °HOME CARE INSTRUCTIONS °Watch your back pain for any changes. The following actions may help to lessen any discomfort you are feeling: °· Remain active. It is stressful on your back to sit or stand in one place for long periods of time. Do not sit, drive, or stand in one place for more than 30 minutes at a time. Take short walks on even surfaces as soon as you are able. Try to increase the length of time you walk each day. °· Exercise regularly as directed by your health care provider. Exercise helps your back heal faster. It also helps avoid future injury by keeping your muscles strong and flexible. °· Do not stay in bed. Resting more than 1-2 days can delay your recovery. °· Pay attention to your body when you bend and lift. The most comfortable positions are those that put less stress on your recovering back. Always use proper lifting techniques, including: °· Bending your knees. °· Keeping the load close to your body. °· Avoiding twisting. °· Find a comfortable position to sleep. Use a firm mattress and lie on your side with your knees slightly bent. If you lie on your back, put a pillow under your knees. °· Avoid feeling anxious or stressed. Stress increases muscle tension and can worsen back pain. It is important to recognize when you are anxious or stressed and learn ways to manage it, such as with exercise. °· Take medicines only as directed by your health care provider. Over-the-counter  medicines to reduce pain and inflammation are often the most helpful. Your health care provider may prescribe muscle relaxant drugs. These medicines help dull your pain so you can more quickly return to your normal activities and healthy exercise. °· Apply ice to the injured area: °· Put ice in a plastic bag. °· Place a towel between your skin and the bag. °· Leave the ice on for 20 minutes, 2-3 times a day for the first 2-3 days. After that, ice and heat may be alternated to reduce pain and spasms. °· Maintain a healthy weight. Excess weight puts extra stress on your back and makes it difficult to maintain good posture. °SEEK MEDICAL CARE IF: °· You have pain that is not relieved with rest or medicine. °· You have increasing pain going down into the legs or buttocks. °· You have pain that does not improve in one week. °· You have night pain. °· You lose weight. °· You have a fever or chills. °SEEK IMMEDIATE MEDICAL CARE IF:  °· You develop new bowel or bladder control problems. °· You have unusual weakness or numbness in your arms or legs. °· You develop nausea or vomiting. °· You develop abdominal pain. °· You feel faint. °  °This information is not intended to replace advice given to you by your health care provider. Make sure you discuss any questions you have with your health care provider. °  °Document Released: 04/20/2005 Document Revised: 05/11/2014 Document Reviewed: 08/22/2013 °Elsevier Interactive Patient Education ©2016 Elsevier   Inc. ° °Heat Therapy °Heat therapy can help ease sore, stiff, injured, and tight muscles and joints. Heat relaxes your muscles, which may help ease your pain.  °RISKS AND COMPLICATIONS °If you have any of the following conditions, do not use heat therapy unless your health care provider has approved: °· Poor circulation. °· Healing wounds or scarred skin in the area being treated. °· Diabetes, heart disease, or high blood pressure. °· Not being able to feel (numbness) the area  being treated. °· Unusual swelling of the area being treated. °· Active infections. °· Blood clots. °· Cancer. °· Inability to communicate pain. This may include young children and people who have problems with their brain function (dementia). °· Pregnancy. °Heat therapy should only be used on old, pre-existing, or long-lasting (chronic) injuries. Do not use heat therapy on new injuries unless directed by your health care provider. °HOW TO USE HEAT THERAPY °There are several different kinds of heat therapy, including: °· Moist heat pack. °· Warm water bath. °· Hot water bottle. °· Electric heating pad. °· Heated gel pack. °· Heated wrap. °· Electric heating pad. °Use the heat therapy method suggested by your health care provider. Follow your health care provider's instructions on when and how to use heat therapy. °GENERAL HEAT THERAPY RECOMMENDATIONS °· Do not sleep while using heat therapy. Only use heat therapy while you are awake. °· Your skin may turn pink while using heat therapy. Do not use heat therapy if your skin turns red. °· Do not use heat therapy if you have new pain. °· High heat or long exposure to heat can cause burns. Be careful when using heat therapy to avoid burning your skin. °· Do not use heat therapy on areas of your skin that are already irritated, such as with a rash or sunburn. °SEEK MEDICAL CARE IF: °· You have blisters, redness, swelling, or numbness. °· You have new pain. °· Your pain is worse. °MAKE SURE YOU: °· Understand these instructions. °· Will watch your condition. °· Will get help right away if you are not doing well or get worse. °  °This information is not intended to replace advice given to you by your health care provider. Make sure you discuss any questions you have with your health care provider. °  °Document Released: 07/13/2011 Document Revised: 05/11/2014 Document Reviewed: 06/13/2013 °Elsevier Interactive Patient Education ©2016 Elsevier Inc. ° ° °To find a primary  care or specialty doctor please call [phone] or [phone] to access "[facility] Find a Doctor Service." ° °You may also go on the [facility] website at [email or web address] ° °There are also multiple [facility] practices throughout the [place] that are frequently accepting new patients. You may find a clinic that is close to your home and contact them. ° °[facility] -  °[address] °[place] [address] °[phone] ° °[facility] in [place] (also locations in [place] and [place]) -  °[address] °[place] [address] °[phone] ° °[facility] -  °[address] °[place] [address] °[phone] ° ° ° °

## 2015-08-12 NOTE — ED Provider Notes (Signed)
By signing my name below, I, Lorenza Chick, attest that this documentation has been prepared under the direction and in the presence of Raelyn Number, DO Electronically Signed: Lorenza Chick, ED Scribe. 08/12/2015. 12:26 AM.    TIME SEEN: 1:11 AM   CHIEF COMPLAINT:  Chief Complaint  Patient presents with  . Back Pain     HPI: HPI Comments: Nathan Poole is a 25 y.o. male who presents to the Emergency Department complaining of low back pain that began tonight. He describes his pain as sharp. He was walking at time of onset and notes his pain is also exacerbated by walking. Pt denies previous back surgeries, h/o cancer, IV drug use, DM, numbness/tingling in his extremities, Bowel or bladder incontinence, fever, or previous similar symptoms.  No alleviating factors noted.    Pt states that he is allergic to morphine and penicillin.    ROS: See HPI Constitutional: no fever  Eyes: no drainage  ENT: no runny nose   Cardiovascular:  no chest pain  Resp: no SOB  GI: no vomiting GU: no dysuria Integumentary: no rash  Allergy: no hives  Musculoskeletal: no leg swelling  Neurological: no slurred speech ROS otherwise negative  PAST MEDICAL HISTORY/PAST SURGICAL HISTORY:  Past Medical History  Diagnosis Date  . Asthma   . Bipolar 1 disorder (HCC)   . Depression   . Lower back injury     MEDICATIONS:  Prior to Admission medications   Medication Sig Start Date End Date Taking? Authorizing Provider  ALPRAZolam (XANAX) 0.25 MG tablet Take 0.25 mg by mouth 3 (three) times daily as needed (anxiety).    Historical Provider, MD  traZODone (DESYREL) 150 MG tablet Take 150 mg by mouth at bedtime.    Historical Provider, MD    ALLERGIES:  Allergies  Allergen Reactions  . Morphine And Related Swelling  . Penicillins Swelling  . Strawberry Extract     Swelling   . Tylenol [Acetaminophen]     Swelling      SOCIAL HISTORY:  Social History  Substance Use Topics  . Smoking status:  Current Every Day Smoker -- 1.00 packs/day for 5 years    Types: Cigarettes  . Smokeless tobacco: Current User  . Alcohol Use: No    FAMILY HISTORY: No family history on file.  EXAM: BP 96/65 mmHg  Pulse 58  Temp(Src) 97.6 F (36.4 C) (Oral)  Resp 16  Ht  (1.981 m)  Wt 176 lb (79.833 kg)  BMI 20.34 kg/m2  SpO2 99% CONSTITUTIONAL: Alert and oriented and responds appropriately to questions. Well-appearing; well-nourished HEAD: Normocephalic EYES: Conjunctivae clear, PERRL ENT: normal nose; no rhinorrhea; moist mucous membranes NECK: Supple, no meningismus, no LAD  CARD: RRR; S1 and S2 appreciated; no murmurs, no clicks, no rubs, no gallops RESP: Normal chest excursion without splinting or tachypnea; breath sounds clear and equal bilaterally; no wheezes, no rhonchi, no rales, no hypoxia or respiratory distress, speaking full sentences ABD/GI: Normal bowel sounds; non-distended; soft, non-tender, no rebound, no guarding, no peritoneal signs BACK:  The back appears normal and isTender diffusely throughout the lower lumbar spine, no midline spinal tenderness or step-off or deformity, there is no CVA tenderness EXT: Normal ROM in all joints; non-tender to palpation; no edema; normal capillary refill; no cyanosis, no calf tenderness or swelling    SKIN: Normal color for age and race; warm; no rash NEURO: Moves all extremities equally, sensation to light touch intact diffusely, cranial nerves II through XII intact; no  saddle anesthesia, normal gait PSYCH: The patient's mood and manner are appropriate. Grooming and personal hygiene are appropriate.  MEDICAL DECISION MAKING: Patient here with lower back pain. Appears to have a muscle strain, spasm. We'll give him a dose of ibuprofen in the emergency department. He does seem very drowsy but is arousable. Denies any drug or alcohol use. We'll give him ibuprofen currently but he states that he does not currently have a ride home. We'll  discharge him with a short prescription for Robaxin but I will not provide him with this medication currently given he is drowsy and states he plans to walk. I do not feel he needs narcotics. No red flag symptoms. Do not feel he needs emergent imaging. Doubt cauda equina, spinal stenosis, epidural abscess or hematoma, transverse myelitis, discitis, fracture. Discussed return precautions.  At this time, I do not feel there is any life-threatening condition present. I have reviewed and discussed all results (EKG, imaging, lab, urine as appropriate), exam findings with patient. I have reviewed nursing notes and appropriate previous records.  I feel the patient is safe to be discharged home without further emergent workup. Discussed usual and customary return precautions. Patient and family (if present) verbalize understanding and are comfortable with this plan.  Patient will follow-up with their primary care provider. If they do not have a primary care provider, information for follow-up has been provided to them. All questions have been answered.    I personally performed the services described in this documentation, which was scribed in my presence. The recorded information has been reviewed and is accurate.    Layla MawKristen N Nykolas Bacallao, DO 08/12/15 [phone]
